# Patient Record
Sex: Female | Born: 1945 | Race: White | Hispanic: No | Marital: Married | Smoking: Never smoker
Health system: Southern US, Community
[De-identification: ages and names within clinical notes are randomized; demographics above are authoritative.]

## PROBLEM LIST (undated history)

## (undated) DIAGNOSIS — M1990 Unspecified osteoarthritis, unspecified site: Secondary | ICD-10-CM

## (undated) DIAGNOSIS — K219 Gastro-esophageal reflux disease without esophagitis: Secondary | ICD-10-CM

## (undated) DIAGNOSIS — I10 Essential (primary) hypertension: Secondary | ICD-10-CM

## (undated) HISTORY — DX: Gastro-esophageal reflux disease without esophagitis: K21.9

## (undated) HISTORY — PX: TONSILLECTOMY: SUR1361

## (undated) HISTORY — DX: Unspecified osteoarthritis, unspecified site: M19.90

## (undated) HISTORY — DX: Essential (primary) hypertension: I10

---

## 2010-02-11 ENCOUNTER — Emergency Department: Payer: Self-pay | Admitting: Emergency Medicine

## 2014-01-08 ENCOUNTER — Emergency Department: Payer: Self-pay | Admitting: Emergency Medicine

## 2014-01-08 LAB — COMPREHENSIVE METABOLIC PANEL
ALT: 23 U/L
ANION GAP: 6 — AB (ref 7–16)
AST: 16 U/L (ref 15–37)
Albumin: 3.4 g/dL (ref 3.4–5.0)
Alkaline Phosphatase: 100 U/L
BUN: 11 mg/dL (ref 7–18)
Bilirubin,Total: 0.6 mg/dL (ref 0.2–1.0)
CHLORIDE: 102 mmol/L (ref 98–107)
Calcium, Total: 9.2 mg/dL (ref 8.5–10.1)
Co2: 28 mmol/L (ref 21–32)
Creatinine: 0.71 mg/dL (ref 0.60–1.30)
Glucose: 118 mg/dL — ABNORMAL HIGH (ref 65–99)
Osmolality: 272 (ref 275–301)
Potassium: 4 mmol/L (ref 3.5–5.1)
Sodium: 136 mmol/L (ref 136–145)
Total Protein: 7.2 g/dL (ref 6.4–8.2)

## 2014-01-08 LAB — URINALYSIS, COMPLETE
BILIRUBIN, UR: NEGATIVE
Bacteria: NONE SEEN
Glucose,UR: NEGATIVE mg/dL (ref 0–75)
Ketone: NEGATIVE
Nitrite: NEGATIVE
PH: 8 (ref 4.5–8.0)
Protein: NEGATIVE
RBC,UR: 3 /HPF (ref 0–5)
Specific Gravity: 1.003 (ref 1.003–1.030)
Squamous Epithelial: NONE SEEN
Transitional Epi: <1
WBC UR: 19 /HPF (ref 0–5)

## 2014-01-08 LAB — CBC
HCT: 44.2 % (ref 35.0–47.0)
HGB: 14.7 g/dL (ref 12.0–16.0)
MCH: 30.5 pg (ref 26.0–34.0)
MCHC: 33.3 g/dL (ref 32.0–36.0)
MCV: 92 fL (ref 80–100)
Platelet: 290 10*3/uL (ref 150–440)
RBC: 4.83 10*6/uL (ref 3.80–5.20)
RDW: 13 % (ref 11.5–14.5)
WBC: 8.2 10*3/uL (ref 3.6–11.0)

## 2014-01-08 LAB — TROPONIN I: Troponin-I: <0.02 ng/mL

## 2014-03-17 ENCOUNTER — Emergency Department: Payer: Self-pay | Admitting: Emergency Medicine

## 2014-04-03 HISTORY — PX: HAND SURGERY: SHX662

## 2014-04-18 ENCOUNTER — Ambulatory Visit: Payer: Self-pay | Admitting: Orthopedic Surgery

## 2014-04-26 ENCOUNTER — Ambulatory Visit: Payer: Self-pay | Admitting: Orthopedic Surgery

## 2014-09-24 NOTE — Op Note (Signed)
PATIENT NAMClaudine Torres:  Clarida, Collyns MR#:  [id number] DATE OF BIRTH:  May 17, 1946  DATE OF PROCEDURE:  04/26/2014  PREOPERATIVE DIAGNOSIS: Chronic extensor tendon subluxation, right long and ring fingers.   POSTOPERATIVE DIAGNOSIS: Chronic extensor tendon subluxation, right long and ring fingers.   PROCEDURE: Extensor tendon realignment, right long and ring fingers.   ANESTHESIA: MAC with local.   SURGEON: Leitha SchullerMichael J. Acie Custis, MD   DESCRIPTION OF PROCEDURE: The patient was brought to the Operating Room and after adequate anesthesia was obtained, the right hand was prepped and draped in the usual sterile fashion. No tourniquet was used. After an initial timeout procedure had been taken, 10 mL of 0.5% Sensorcaine was infiltrated in the area over the extensor tendons at the MCP joint of the long and ring fingers. No tourniquet was required. An incision was made with a flap, radially based. Elevating this flap, the extensor tendon was identified. It was subluxed and there was a tight ulnar-sided structure. The long finger was taken care of first. This was released and THIS allowed for realignment of the tendon. The radial side structures were quite stretched and attenuated. These were incised with a pants-over-vest type closure. The extensor tendon could be a realigned and placed through a range of motion, maintaining midline position of the extensor tendon. Then 4-0 Monocryl and 3-0 Vicryl were used to repair the radial side structures, with local tissues being utilized.   An identical procedure was then carried out on the ring finger with similar results, with good alignment of the extensor tendon. The wounds were irrigated and closed with 4-0 nylon in a simple interrupted fashion. Xeroform, 4x4's, Webril and a dorsal splint with the MCPs in flexion was then applied. The patient was sent to the recovery room in stable condition.   ESTIMATED BLOOD LOSS: Minimal.   COMPLICATIONS: None.   SPECIMEN: None.     ____________________________ Leitha SchullerMichael J. Jaye Saal, MD mjm:MT D: 04/26/2014 09:18:59 ET T: 04/26/2014 11:01:12 ET JOB#: [id number]  cc: Leitha SchullerMichael J. Endrit Gittins, MD, <Dictator> Leitha SchullerMICHAEL J Francisca Harbuck MD ELECTRONICALLY SIGNED 04/26/2014 14:25

## 2014-11-09 DIAGNOSIS — M2420 Disorder of ligament, unspecified site: Secondary | ICD-10-CM | POA: Insufficient documentation

## 2014-12-21 ENCOUNTER — Telehealth: Payer: Self-pay | Admitting: Family Medicine

## 2014-12-21 NOTE — Telephone Encounter (Signed)
Pt is having dizziness and would like someone to call her back.  [phone]  Husbands [phone]

## 2014-12-23 NOTE — Telephone Encounter (Signed)
Spoke with patient and she states that she is taking dramamine 1/2 tablet 2 to 3 times and she is feeling better. She is on the way to see her cardiologist and will see what they say and will call us back and let us know-aa

## 2014-12-27 ENCOUNTER — Other Ambulatory Visit: Payer: Self-pay | Admitting: Family Medicine

## 2014-12-27 DIAGNOSIS — I10 Essential (primary) hypertension: Secondary | ICD-10-CM

## 2015-01-02 DIAGNOSIS — R42 Dizziness and giddiness: Secondary | ICD-10-CM | POA: Insufficient documentation

## 2015-01-02 DIAGNOSIS — K219 Gastro-esophageal reflux disease without esophagitis: Secondary | ICD-10-CM | POA: Insufficient documentation

## 2015-01-02 DIAGNOSIS — I10 Essential (primary) hypertension: Secondary | ICD-10-CM | POA: Insufficient documentation

## 2015-01-02 DIAGNOSIS — M1990 Unspecified osteoarthritis, unspecified site: Secondary | ICD-10-CM | POA: Insufficient documentation

## 2015-01-03 ENCOUNTER — Ambulatory Visit (INDEPENDENT_AMBULATORY_CARE_PROVIDER_SITE_OTHER): Payer: Medicare HMO | Admitting: Family Medicine

## 2015-01-03 ENCOUNTER — Ambulatory Visit
Admission: RE | Admit: 2015-01-03 | Discharge: 2015-01-03 | Disposition: A | Discharge status: Home / Self Care | Payer: Medicare HMO | Source: Ambulatory Visit | Attending: Family Medicine | Admitting: Family Medicine

## 2015-01-03 ENCOUNTER — Encounter: Payer: Self-pay | Admitting: Family Medicine

## 2015-01-03 VITALS — BP 150/78 | HR 72 | Temp 98.2°F | Resp 16 | Wt 113.8 lb

## 2015-01-03 DIAGNOSIS — Z01818 Encounter for other preprocedural examination: Secondary | ICD-10-CM

## 2015-01-03 DIAGNOSIS — R0989 Other specified symptoms and signs involving the circulatory and respiratory systems: Secondary | ICD-10-CM | POA: Diagnosis not present

## 2015-01-03 DIAGNOSIS — I10 Essential (primary) hypertension: Secondary | ICD-10-CM | POA: Diagnosis not present

## 2015-01-03 DIAGNOSIS — M24341 Pathological dislocation of right hand, not elsewhere classified: Secondary | ICD-10-CM
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
## 2015-01-06 ENCOUNTER — Telehealth: Payer: Self-pay

## 2015-01-06 NOTE — Telephone Encounter (Signed)
Left a message on patient's home voicemail (consent in chart) advising her all test were normal and she is cleared for surgery.

## 2015-01-06 NOTE — Telephone Encounter (Signed)
-----   Message from Tamsen Roers, [place] sent at 01/06/2015  8:51 AM EDT ----- No sign of infection in lungs. Medically cleared for orthopedic surgery.

## 2015-03-16 DIAGNOSIS — M21241 Flexion deformity, right finger joints: Secondary | ICD-10-CM | POA: Diagnosis not present

## 2015-04-12 DIAGNOSIS — M24231 Disorder of ligament, right wrist: Secondary | ICD-10-CM | POA: Diagnosis not present

## 2015-04-12 DIAGNOSIS — M21241 Flexion deformity, right finger joints: Secondary | ICD-10-CM | POA: Diagnosis not present

## 2015-04-21 ENCOUNTER — Encounter: Payer: Self-pay | Admitting: Family Medicine

## 2015-04-21 ENCOUNTER — Ambulatory Visit (INDEPENDENT_AMBULATORY_CARE_PROVIDER_SITE_OTHER): Payer: Medicare HMO | Admitting: Family Medicine

## 2015-04-21 VITALS — BP 172/82 | HR 71 | Temp 98.6°F | Resp 16 | Wt 115.2 lb

## 2015-04-21 DIAGNOSIS — G250 Essential tremor: Secondary | ICD-10-CM

## 2015-04-21 DIAGNOSIS — R5383 Other fatigue: Secondary | ICD-10-CM

## 2015-04-21 DIAGNOSIS — I10 Essential (primary) hypertension: Secondary | ICD-10-CM

## 2015-04-21 NOTE — Progress Notes (Signed)
Patient ID: Destiny MoutonGlenda Torres, female   DOB: 11/23/1945, 69 y.o.   MRN: [id number]    Subjective:  Hypertension This is a chronic problem. The current episode started more than 1 year ago. The problem has been gradually worsening since onset. The problem is uncontrolled. Associated symptoms include anxiety and malaise/fatigue. Pertinent negatives include no chest pain, palpitations, peripheral edema, shortness of breath or sweats. There are no associated agents to hypertension. Past treatments include diuretics. There are no compliance problems.   Having some intermittent fatigue and occasional tremor in hands. States she has had this more prominent the past couple weeks. Slight issue with balance that she uses Dramamine to control. Denies dizziness or vertigo. Prior to Admission medications   Medication Sig Start Date End Date Taking? Authorizing Provider  Cholecalciferol (D-5000) 5000 UNITS TABS Take by mouth.   Yes Historical Provider, MD  dimenhyDRINATE (DRAMAMINE) 50 MG tablet Take by mouth.   Yes Historical Provider, MD  Magnesium 500 MG CAPS Take by mouth.   Yes Historical Provider, MD  Multiple Vitamin (MULTI-VITAMINS) TABS Take by mouth.   Yes Historical Provider, MD  triamterene-hydrochlorothiazide (MAXZIDE-25) 37.5-25 MG per tablet TAKE ONE TABLET BY MOUTH ONCE DAILY 12/27/14  Yes Jodell Ciproennis E Chrismon, PA  vitamin C (ASCORBIC ACID) 500 MG tablet Take by mouth.   Yes Historical Provider, MD    Patient Active Problem List   Diagnosis Date Noted  . Arthritis 01/02/2015  . Acid reflux 01/02/2015  . Benign hypertension 01/02/2015  . Dizziness 01/02/2015  . Essential (primary) hypertension 01/02/2015  . Gastro-esophageal reflux disease without esophagitis 01/02/2015  . 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 Hand surgery Right 04/2014  . Tonsillectomy     Family History  Problem Relation Age of Onset  . Heart disease Mother   . Arthritis Mother     . Heart disease Father   . Stroke Father    History reviewed. No pertinent past medical history.  Social History   Social History  . Marital Status: Married    Spouse Name: N/A  . Number of Children: N/A  . Years of Education: N/A   Occupational History  . Not on file.   Social History Main Topics  . Smoking status: Never Smoker   . Smokeless tobacco: Never Used  . Alcohol Use: No  . Drug Use: No  . Sexual Activity: Not on file   Other Topics Concern  . Not on file   Social History Narrative    Allergies  Allergen Reactions  . Penicillins     patient unsure of reaction  . Streptomycin Rash    Review of Systems  Constitutional: Positive for malaise/fatigue.  HENT: Negative.   Eyes: Negative.   Respiratory: Negative.  Negative for shortness of breath.   Cardiovascular: Negative.  Negative for chest pain and palpitations.  Gastrointestinal: Negative.   Genitourinary: Negative.   Musculoskeletal: Negative.   Skin: Negative.   Neurological: Positive for tremors.       Mild in hands over the past year. Intermittent and feels it could be associated with elevation of BP and stress at home (cares for aunt and husband who has PTSD).  Endo/Heme/Allergies: Negative.   Psychiatric/Behavioral: Negative.     There is no immunization history on file for this patient. Objective:  BP 172/82 mmHg  Pulse 71  Temp(Src) 98.6 F (37 C) (Oral)  Resp 16  Wt 115 lb 3.2 oz (52.254 kg)  SpO2 98%  Physical Exam  Constitutional: She is oriented to person, place, and time and well-developed, well-nourished, and in no distress.  HENT:  Head: Normocephalic.  Right Ear: External ear normal.  Left Ear: External ear normal.  Mouth/Throat: Oropharynx is clear and moist.  Eyes: Conjunctivae and EOM are normal.  Neck: Normal range of motion. Neck supple.  Cardiovascular: Normal rate and regular rhythm.   Pulmonary/Chest: Effort normal and breath sounds normal.  Abdominal: Soft.  Bowel sounds are normal.  Musculoskeletal:  Enlarged MCP joints of hands. Some triggering of fingers and history of surgery to right hand joints.  Neurological: She is alert and oriented to person, place, and time. She has normal reflexes. Gait normal.  No tremor today.  Psychiatric: Affect and judgment normal.    Lab Results  Component Value Date   WBC 8.2 01/08/2014   HGB 14.7 01/08/2014   HCT 44.2 01/08/2014   PLT 290 01/08/2014   GLUCOSE 118* 01/08/2014    CMP     Component Value Date/Time   NA 136 01/08/2014 1137   K 4.0 01/08/2014 1137   CL 102 01/08/2014 1137   CO2 28 01/08/2014 1137   GLUCOSE 118* 01/08/2014 1137   BUN 11 01/08/2014 1137   CREATININE 0.71 01/08/2014 1137   CALCIUM 9.2 01/08/2014 1137   PROT 7.2 01/08/2014 1137   ALBUMIN 3.4 01/08/2014 1137   AST 16 01/08/2014 1137   ALT 23 01/08/2014 1137   ALKPHOS 100 01/08/2014 1137   BILITOT 0.6 01/08/2014 1137   GFRNONAA >60 01/08/2014 1137   GFRAA >60 01/08/2014 1137    Assessment and Plan :  1. Essential (primary) hypertension  Recent change in systolic pressure. Still taking the Maxzide daily. Will check routine labs. With tremor, may need to consider adding, or switching to a beta blocker. Recheck pending lab reports. - Lipid panel  2. Benign essential tremor Recent onset. None today. Check labs for metabolic imbalance. States other elder family members have had some tremors. May need beta blocker to control pending reports from labs. - TSH  3. Other fatigue Intermittent. Has gotten a good meal this morning and energy level better than yesterday. Check labs and recheck pending report. - COMPLETE METABOLIC PANEL WITH GFR - CBC with Differential/Platelet   Dortha Kern PA [facility] 04/21/2015 10:04 AM

## 2015-04-24 DIAGNOSIS — G250 Essential tremor: Secondary | ICD-10-CM | POA: Diagnosis not present

## 2015-04-24 DIAGNOSIS — I10 Essential (primary) hypertension: Secondary | ICD-10-CM | POA: Diagnosis not present

## 2015-04-24 DIAGNOSIS — R5383 Other fatigue: Secondary | ICD-10-CM | POA: Diagnosis not present

## 2015-04-25 LAB — CBC WITH DIFFERENTIAL/PLATELET
BASOS: 0 %
Basophils Absolute: 0 10*3/uL (ref 0.0–0.2)
EOS (ABSOLUTE): 0.1 10*3/uL (ref 0.0–0.4)
Eos: 1 %
Hematocrit: 40.3 % (ref 34.0–46.6)
Hemoglobin: 13.4 g/dL (ref 11.1–15.9)
IMMATURE GRANULOCYTES: 1 %
Immature Grans (Abs): 0 10*3/uL (ref 0.0–0.1)
Lymphocytes Absolute: 1.9 10*3/uL (ref 0.7–3.1)
Lymphs: 29 %
MCH: 29.4 pg (ref 26.6–33.0)
MCHC: 33.3 g/dL (ref 31.5–35.7)
MCV: 88 fL (ref 79–97)
MONOCYTES: 9 %
MONOS ABS: 0.6 10*3/uL (ref 0.1–0.9)
NEUTROS PCT: 60 %
Neutrophils Absolute: 4 10*3/uL (ref 1.4–7.0)
Platelets: 303 10*3/uL (ref 150–379)
RBC: 4.56 x10E6/uL (ref 3.77–5.28)
RDW: 13.3 % (ref 12.3–15.4)
WBC: 6.6 10*3/uL (ref 3.4–10.8)

## 2015-04-25 LAB — COMPREHENSIVE METABOLIC PANEL
A/G RATIO: 1.6 (ref 1.1–2.5)
ALT: 11 IU/L (ref 0–32)
AST: 17 IU/L (ref 0–40)
Albumin: 3.8 g/dL (ref 3.5–4.8)
Alkaline Phosphatase: 101 IU/L (ref 39–117)
BUN/Creatinine Ratio: 23 (ref 11–26)
BUN: 16 mg/dL (ref 8–27)
Bilirubin Total: 0.4 mg/dL (ref 0.0–1.2)
CALCIUM: 9.5 mg/dL (ref 8.7–10.3)
CO2: 27 mmol/L (ref 18–29)
Chloride: 97 mmol/L (ref 97–106)
Creatinine, Ser: 0.71 mg/dL (ref 0.57–1.00)
GFR, EST AFRICAN AMERICAN: 99 mL/min/{1.73_m2} (ref >59)
GFR, EST NON AFRICAN AMERICAN: 86 mL/min/{1.73_m2} (ref >59)
GLUCOSE: 92 mg/dL (ref 65–99)
Globulin, Total: 2.4 g/dL (ref 1.5–4.5)
Potassium: 3.9 mmol/L (ref 3.5–5.2)
Sodium: 138 mmol/L (ref 136–144)
TOTAL PROTEIN: 6.2 g/dL (ref 6.0–8.5)

## 2015-04-25 LAB — TSH: TSH: 3.28 u[IU]/mL (ref 0.450–4.500)

## 2015-04-25 LAB — LIPID PANEL
CHOL/HDL RATIO: 2.4 ratio (ref 0.0–4.4)
Cholesterol, Total: 196 mg/dL (ref 100–199)
HDL: 81 mg/dL (ref >39)
LDL Calculated: 102 mg/dL — ABNORMAL HIGH (ref 0–99)
TRIGLYCERIDES: 65 mg/dL (ref 0–149)
VLDL Cholesterol Cal: 13 mg/dL (ref 5–40)

## 2015-04-28 ENCOUNTER — Other Ambulatory Visit: Payer: Self-pay | Admitting: Family Medicine

## 2015-04-28 ENCOUNTER — Telehealth: Payer: Self-pay | Admitting: Family Medicine

## 2015-04-28 NOTE — Telephone Encounter (Signed)
Please advise lab results.

## 2015-04-28 NOTE — Telephone Encounter (Signed)
Pt called for lab results  Call [phone]  Husbands cell number if you call her back today  Thanks teri

## 2015-05-01 ENCOUNTER — Telehealth: Payer: Self-pay

## 2015-05-01 NOTE — Telephone Encounter (Signed)
Left message to call back  

## 2015-05-01 NOTE — Telephone Encounter (Signed)
Advised patient as below. Patient also wanted to know if the labs that were drawn showed any indication that she may have parkinson's disease? Patient reports that she has "all the signs for it". Patient also mentions that she now has lost her sense of smell. Could she have possibly had a stroke? Patient reports that she is willing to come back in for an OV if she needs to be re-evaluated. Patient is also requesting a refill on Maxzide into the pharmacy. She uses [facility] on [address].

## 2015-05-01 NOTE — Telephone Encounter (Signed)
-----   Message from Tamsen Roersennis E Chrismon, [place] sent at 05/01/2015  5:36 AM EST ----- All blood tests are normal. May add Metoprolol Succinate 25 mg qd #30 & 3 RF for BP elevation and tremor. Recheck BP in 4 weeks.

## 2015-05-01 NOTE — Telephone Encounter (Signed)
See lab result note.

## 2015-05-02 ENCOUNTER — Telehealth: Payer: Self-pay | Admitting: Family Medicine

## 2015-05-02 DIAGNOSIS — I10 Essential (primary) hypertension: Secondary | ICD-10-CM

## 2015-05-02 MED ORDER — METOPROLOL SUCCINATE ER 25 MG PO TB24: 25.0000 mg | ORAL_TABLET | Freq: Every day | ORAL | Status: DC

## 2015-05-02 NOTE — Telephone Encounter (Signed)
Contacted patient. Patient states Metoprolol RX was not sent to pharmacy yesterday like indicated in message on 11/28. RX sent to pharmacy.

## 2015-05-02 NOTE — Telephone Encounter (Signed)
Pt has some questions about the new medication she is suppose to be taking.  JX#[phone]/ZHCB#[phone]/MW

## 2015-05-05 ENCOUNTER — Ambulatory Visit (INDEPENDENT_AMBULATORY_CARE_PROVIDER_SITE_OTHER): Payer: Medicare HMO | Admitting: Family Medicine

## 2015-05-05 ENCOUNTER — Encounter: Payer: Self-pay | Admitting: Family Medicine

## 2015-05-05 VITALS — BP 162/78 | HR 78 | Temp 98.5°F | Resp 16 | Wt 113.6 lb

## 2015-05-05 DIAGNOSIS — J40 Bronchitis, not specified as acute or chronic: Secondary | ICD-10-CM

## 2015-05-05 MED ORDER — AZITHROMYCIN 250 MG PO TABS: ORAL_TABLET | ORAL | Status: DC

## 2015-05-05 NOTE — Progress Notes (Signed)
Patient ID: Keylin Ferryman, female   DOB: [birth year], 69 y.o.   MRN: [id number] Name: Destiny Torres   MRN: [id number]    DOB: August 03, 1945   Date:05/05/2015       Progress Note  Subjective  Chief Complaint  Chief Complaint  Patient presents with  . URI   URI  This is a new problem. The current episode started 1 to 4 weeks ago. The problem has been gradually improving. There has been no fever. Associated symptoms include congestion, coughing, a plugged ear sensation, rhinorrhea, sneezing and a sore throat. She has tried nothing for the symptoms.   Patient Active Problem List   Diagnosis Date Noted  . Arthritis 01/02/2015  . Acid reflux 01/02/2015  . Benign hypertension 01/02/2015  . Dizziness 01/02/2015  . Essential (primary) hypertension 01/02/2015  . Gastro-esophageal reflux disease without esophagitis 01/02/2015  . 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 Hand surgery Right 04/2014  . Tonsillectomy     Family History  Problem Relation Age of Onset  . Heart disease Mother   . Arthritis Mother   . Heart disease Father   . Stroke Father     Social History  Substance Use Topics  . Smoking status: Never Smoker   . Smokeless tobacco: Never Used  . Alcohol Use: No    Current outpatient prescriptions:  .  Cholecalciferol (D-5000) 5000 UNITS TABS, Take by mouth., Disp: , Rfl:  .  dimenhyDRINATE (DRAMAMINE) 50 MG tablet, Take by mouth., Disp: , Rfl:  .  Magnesium 500 MG CAPS, Take by mouth., Disp: , Rfl:  .  metoprolol succinate (TOPROL-XL) 25 MG 24 hr tablet, Take 1 tablet (25 mg total) by mouth daily., Disp: 30 tablet, Rfl: 3 .  Multiple Vitamin (MULTI-VITAMINS) TABS, Take by mouth., Disp: , Rfl:  .  triamterene-hydrochlorothiazide (MAXZIDE-25) 37.5-25 MG tablet, TAKE ONE TABLET BY MOUTH ONCE DAILY, Disp: 30 tablet, Rfl: 0 .  vitamin C (ASCORBIC ACID) 500 MG tablet, Take by mouth., Disp: , Rfl:   Allergies  Allergen Reactions  .  Penicillins     patient unsure of reaction  . Streptomycin Rash    Review of Systems  HENT: Positive for congestion, rhinorrhea, sneezing and sore throat.   Eyes: Negative.   Respiratory: Positive for cough.   Cardiovascular: Negative.   Gastrointestinal: Negative.   Genitourinary: Negative.   Musculoskeletal: Negative.   Skin: Negative.   Neurological: Positive for tremors and weakness.  Endo/Heme/Allergies: Negative.   Psychiatric/Behavioral: Negative.    Objective  Filed Vitals:   05/05/15 1014  BP: 162/78  Pulse: 78  Temp: 98.5 F (36.9 C)  TempSrc: Oral  Resp: 16  Weight: 113 lb 9.6 oz (51.529 kg)  SpO2: 98%   BP Readings from Last 3 Encounters:  05/05/15 162/78  04/21/15 172/82  01/03/15 150/78    Physical Exam  Constitutional: She is oriented to person, place, and time and well-developed, well-nourished, and in no distress.  HENT:  Head: Normocephalic and atraumatic.  Right Ear: External ear normal.  Left Ear: External ear normal.  Nose: Nose normal.  Mouth/Throat: Oropharynx is clear and moist.  Slight tenderness left maxillary sinus.  Eyes: Conjunctivae and EOM are normal.  Neck: Normal range of motion. Neck supple.  Cardiovascular: Normal rate, regular rhythm and normal heart sounds.   Pulmonary/Chest: Effort normal.  Some rhonchi in posterior mid lung fields.  Neurological: She is alert and oriented to person, place, and time.  Recent Results (from the past 2160 hour(s))  TSH     Status: None   Collection Time: 04/24/15  8:20 AM  Result Value Ref Range   TSH 3.280 0.450 - 4.500 uIU/mL  CBC with Differential/Platelet     Status: None   Collection Time: 04/24/15  8:20 AM  Result Value Ref Range   WBC 6.6 3.4 - 10.8 x10E3/uL   RBC 4.56 3.77 - 5.28 x10E6/uL   Hemoglobin 13.4 11.1 - 15.9 g/dL   Hematocrit 09.640.3 04.534.0 - 46.6 %   MCV 88 79 - 97 fL   MCH 29.4 26.6 - 33.0 pg   MCHC 33.3 31.5 - 35.7 g/dL   RDW 40.913.3 81.112.3 - 91.415.4 %   Platelets 303  150 - 379 x10E3/uL   Neutrophils 60 %   Lymphs 29 %   Monocytes 9 %   Eos 1 %   Basos 0 %   Neutrophils Absolute 4.0 1.4 - 7.0 x10E3/uL   Lymphocytes Absolute 1.9 0.7 - 3.1 x10E3/uL   Monocytes Absolute 0.6 0.1 - 0.9 x10E3/uL   EOS (ABSOLUTE) 0.1 0.0 - 0.4 x10E3/uL   Basophils Absolute 0.0 0.0 - 0.2 x10E3/uL   Immature Granulocytes 1 %   Immature Grans (Abs) 0.0 0.0 - 0.1 x10E3/uL  Lipid panel     Status: Abnormal   Collection Time: 04/24/15  8:20 AM  Result Value Ref Range   Cholesterol, Total 196 100 - 199 mg/dL   Triglycerides 65 0 - 149 mg/dL   HDL 81 >78>39 mg/dL   VLDL Cholesterol Cal 13 5 - 40 mg/dL   LDL Calculated 295102 (H) 0 - 99 mg/dL   Chol/HDL Ratio 2.4 0.0 - 4.4 ratio units    Comment:                                   T. Chol/HDL Ratio                                             Men  Women                               1/2 Avg.Risk  3.4    3.3                                   Avg.Risk  5.0    4.4                                2X Avg.Risk  9.6    7.1                                3X Avg.Risk 23.4   11.0   Comprehensive metabolic panel     Status: None   Collection Time: 04/24/15  8:20 AM  Result Value Ref Range   Glucose 92 65 - 99 mg/dL   BUN 16 8 - 27 mg/dL   Creatinine, Ser 6.210.71 0.57 - 1.00 mg/dL   GFR calc non Af Amer 86 >59 mL/min/1.73   GFR calc Af  Amer 99 >59 mL/min/1.73   BUN/Creatinine Ratio 23 11 - 26   Sodium 138 136 - 144 mmol/L   Potassium 3.9 3.5 - 5.2 mmol/L   Chloride 97 97 - 106 mmol/L   CO2 27 18 - 29 mmol/L   Calcium 9.5 8.7 - 10.3 mg/dL   Total Protein 6.2 6.0 - 8.5 g/dL   Albumin 3.8 3.5 - 4.8 g/dL   Globulin, Total 2.4 1.5 - 4.5 g/dL   Albumin/Globulin Ratio 1.6 1.1 - 2.5   Bilirubin Total 0.4 0.0 - 1.2 mg/dL   Alkaline Phosphatase 101 39 - 117 IU/L   AST 17 0 - 40 IU/L   ALT 11 0 - 32 IU/L    Assessment & Plan  1. Bronchitis Onset over the past 5 days without fever. Some sinus drainage and cough with greenish sputum. Recommend  Mucinex-DM and Azithromycin. Increase fluid intake and may add Tylenol prn. Recheck if no better in 5-7 days. - azithromycin (ZITHROMAX) 250 MG tablet; Two tablets by mouth today then one daily for 4 days.  Dispense: 6 tablet; Refill: 0

## 2015-05-29 ENCOUNTER — Other Ambulatory Visit: Payer: Self-pay | Admitting: Family Medicine

## 2015-05-30 ENCOUNTER — Other Ambulatory Visit: Payer: Self-pay

## 2015-05-30 MED ORDER — TRIAMTERENE-HCTZ 37.5-25 MG PO TABS: 1.0000 | ORAL_TABLET | Freq: Every day | ORAL | Status: DC

## 2015-05-30 NOTE — Telephone Encounter (Signed)
Patient calling to get this filled. Sees [name]. She needs this filled today please.-aa

## 2015-06-01 NOTE — Telephone Encounter (Signed)
Destiny Torres patient 

## 2015-06-07 DIAGNOSIS — M24231 Disorder of ligament, right wrist: Secondary | ICD-10-CM | POA: Diagnosis not present

## 2015-06-27 ENCOUNTER — Ambulatory Visit (INDEPENDENT_AMBULATORY_CARE_PROVIDER_SITE_OTHER): Payer: Medicare HMO | Admitting: Family Medicine

## 2015-06-27 ENCOUNTER — Encounter: Payer: Self-pay | Admitting: Family Medicine

## 2015-06-27 VITALS — BP 140/76 | HR 67 | Temp 98.2°F | Resp 16 | Ht 59.0 in | Wt 115.0 lb

## 2015-06-27 DIAGNOSIS — I10 Essential (primary) hypertension: Secondary | ICD-10-CM

## 2015-06-27 DIAGNOSIS — R42 Dizziness and giddiness: Secondary | ICD-10-CM | POA: Diagnosis not present

## 2015-06-27 DIAGNOSIS — R2689 Other abnormalities of gait and mobility: Secondary | ICD-10-CM

## 2015-06-27 DIAGNOSIS — G252 Other specified forms of tremor: Secondary | ICD-10-CM

## 2015-06-27 DIAGNOSIS — R258 Other abnormal involuntary movements: Secondary | ICD-10-CM

## 2015-06-27 DIAGNOSIS — R29818 Other symptoms and signs involving the nervous system: Secondary | ICD-10-CM | POA: Diagnosis not present

## 2015-06-27 NOTE — Progress Notes (Signed)
Patient ID: Destiny Torres, female   DOB: 1946/01/12, 70 y.o.   MRN: [id number]       Patient: Destiny Torres Female    DOB: 02-13-1946   70 y.o.   MRN: [id number] Visit Date: 06/27/2015  Today's Provider: Dortha Kern, PA   Chief Complaint  Patient presents with  . Hypertension   Subjective:    HPI  Hypertension, follow-up:  BP Readings from Last 3 Encounters:  06/27/15 140/76  05/05/15 162/78  04/21/15 172/82    She was last seen for hypertension 2 months ago.  BP at that visit was 162/78. Management changes since that visit include labs and Metoprolol succinate 25 mg started. She reports excellent compliance with treatment. She is not having side effects.  She is not exercising. She is adherent to low salt diet.   Outside blood pressures are 160/70's. She is experiencing none.  Patient denies chest pain.   Cardiovascular risk factors include advanced age (older than 27 for men, 51 for women).  Use of agents associated with hypertension: none.     Weight trend: stable Wt Readings from Last 3 Encounters:  06/27/15 115 lb (52.164 kg)  05/05/15 113 lb 9.6 oz (51.529 kg)  04/21/15 115 lb 3.2 oz (52.254 kg)    Current diet: in general, a "healthy" diet    ------------------------------------------------------------------------   Follow up for tremors/dizzines  The patient was last seen for this 2 months ago. Changes made at last visit include labs checked.  She reports excellent compliance with treatment. She feels that condition is Worse. Patient reports having more tremors and feeling dizzy.   ------------------------------------------------------------------------------------     Patient Active Problem List   Diagnosis Date Noted  . Arthritis 01/02/2015  . Acid reflux 01/02/2015  . Benign hypertension 01/02/2015  . Dizziness 01/02/2015  . Essential (primary) hypertension 01/02/2015  . Gastro-esophageal reflux disease without esophagitis 01/02/2015    . 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 Hand surgery Right 04/2014  . Tonsillectomy     Family History  Problem Relation Age of Onset  . Heart disease Mother   . Arthritis Mother   . Heart disease Father   . Stroke Father    Allergies  Allergen Reactions  . Penicillins     patient unsure of reaction  . Streptomycin Rash   Previous Medications   CHOLECALCIFEROL (D-5000) 5000 UNITS TABS    Take by mouth.   DIMENHYDRINATE (DRAMAMINE) 50 MG TABLET    Take by mouth.   MAGNESIUM 500 MG CAPS    Take by mouth.   METOPROLOL SUCCINATE (TOPROL-XL) 25 MG 24 HR TABLET    Take 1 tablet (25 mg total) by mouth daily.   MULTIPLE VITAMIN (MULTI-VITAMINS) TABS    Take by mouth.   TRIAMTERENE-HYDROCHLOROTHIAZIDE (MAXZIDE-25) 37.5-25 MG TABLET    TAKE ONE TABLET BY MOUTH ONCE DAILY   VITAMIN C (ASCORBIC ACID) 500 MG TABLET    Take by mouth.    Review of Systems  Constitutional: Positive for fatigue.  Respiratory: Negative.   Cardiovascular: Negative.   Neurological: Positive for dizziness, tremors and weakness.    Social History  Substance Use Topics  . Smoking status: Never Smoker   . Smokeless tobacco: Never Used  . Alcohol Use: No   Objective:   BP 140/76 mmHg  Pulse 67  Temp(Src) 98.2 F (36.8 C) (Oral)  Resp 16  Ht  (1.499 m)  Wt 115 lb (52.164 kg)  BMI  23.21 kg/m2  SpO2 98% BP Readings from Last 3 Encounters:  06/27/15 140/76  05/05/15 162/78  04/21/15 172/82   Wt Readings from Last 3 Encounters:  06/27/15 115 lb (52.164 kg)  05/05/15 113 lb 9.6 oz (51.529 kg)  04/21/15 115 lb 3.2 oz (52.254 kg)     Physical Exam  Constitutional: She appears well-developed and well-nourished.  HENT:  Head: Normocephalic.  Right Ear: External ear normal.  Left Ear: External ear normal.  Nose: Nose normal.  Mouth/Throat: Oropharynx is clear and moist.  Eyes: Conjunctivae and EOM are normal. Pupils are equal, round, and reactive  to light.  Neck: Normal range of motion. Neck supple. No thyromegaly present.  Cardiovascular: Normal rate, regular rhythm, normal heart sounds and intact distal pulses.   Pulmonary/Chest: Effort normal and breath sounds normal.  Abdominal: Soft. Bowel sounds are normal.  Musculoskeletal:  Enlarged MCP joints in both hands with catching of some joints of left hand with decreased grip strength and good flexion of all DIP and PIP joints.      Recent Results (from the past 2160 hour(s))  TSH     Status: None   Collection Time: 04/24/15  8:20 AM  Result Value Ref Range   TSH 3.280 0.450 - 4.500 uIU/mL  CBC with Differential/Platelet     Status: None   Collection Time: 04/24/15  8:20 AM  Result Value Ref Range   WBC 6.6 3.4 - 10.8 x10E3/uL   RBC 4.56 3.77 - 5.28 x10E6/uL   Hemoglobin 13.4 11.1 - 15.9 g/dL   Hematocrit 16.1 09.6 - 46.6 %   MCV 88 79 - 97 fL   MCH 29.4 26.6 - 33.0 pg   MCHC 33.3 31.5 - 35.7 g/dL   RDW 04.5 40.9 - 81.1 %   Platelets 303 150 - 379 x10E3/uL   Neutrophils 60 %   Lymphs 29 %   Monocytes 9 %   Eos 1 %   Basos 0 %   Neutrophils Absolute 4.0 1.4 - 7.0 x10E3/uL   Lymphocytes Absolute 1.9 0.7 - 3.1 x10E3/uL   Monocytes Absolute 0.6 0.1 - 0.9 x10E3/uL   EOS (ABSOLUTE) 0.1 0.0 - 0.4 x10E3/uL   Basophils Absolute 0.0 0.0 - 0.2 x10E3/uL   Immature Granulocytes 1 %   Immature Grans (Abs) 0.0 0.0 - 0.1 x10E3/uL  Lipid panel     Status: Abnormal   Collection Time: 04/24/15  8:20 AM  Result Value Ref Range   Cholesterol, Total 196 100 - 199 mg/dL   Triglycerides 65 0 - 149 mg/dL   HDL 81 >91 mg/dL   VLDL Cholesterol Cal 13 5 - 40 mg/dL   LDL Calculated 478 (H) 0 - 99 mg/dL   Chol/HDL Ratio 2.4 0.0 - 4.4 ratio units    Comment:                                   T. Chol/HDL Ratio                                             Men  Women                               1/2 Avg.Risk  3.4    3.3  Avg.Risk  5.0    4.4                                 2X Avg.Risk  9.6    7.1                                3X Avg.Risk 23.4   11.0   Comprehensive metabolic panel     Status: None   Collection Time: 04/24/15  8:20 AM  Result Value Ref Range   Glucose 92 65 - 99 mg/dL   BUN 16 8 - 27 mg/dL   Creatinine, Ser 1.61 0.57 - 1.00 mg/dL   GFR calc non Af Amer 86 >59 mL/min/1.73   GFR calc Af Amer 99 >59 mL/min/1.73   BUN/Creatinine Ratio 23 11 - 26   Sodium 138 136 - 144 mmol/L   Potassium 3.9 3.5 - 5.2 mmol/L   Chloride 97 97 - 106 mmol/L   CO2 27 18 - 29 mmol/L   Calcium 9.5 8.7 - 10.3 mg/dL   Total Protein 6.2 6.0 - 8.5 g/dL   Albumin 3.8 3.5 - 4.8 g/dL   Globulin, Total 2.4 1.5 - 4.5 g/dL   Albumin/Globulin Ratio 1.6 1.1 - 2.5   Bilirubin Total 0.4 0.0 - 1.2 mg/dL   Alkaline Phosphatase 101 39 - 117 IU/L   AST 17 0 - 40 IU/L   ALT 11 0 - 32 IU/L    Assessment & Plan:     1. Essential (primary) hypertension Improvement in BP control. Tolerating Metoprolol and Maxzide without side effects. Normal labs on 04-24-15. Continue present regime.  2. Dizziness Off balance sensation and slight light headed sensation worsening. No longer getting relief from Dramamine. Will get CT scan and schedule neurology referral. - CT Head Wo Contrast - Ambulatory referral to Neurology  3. Fine tremor Persistent for more than a year. Only seems to occur with active movement. Disappears at rest. Schedule neurology referral. - Ambulatory referral to Neurology  4. Balance problem Feels unsure of balance when walking or standing alone. No significant ataxia. Will get CT scan of head without contrast and schedule neurology referral. - CT Head Wo Contrast - Ambulatory referral to Neurology       Dortha Kern, PA  [facility]

## 2015-07-03 ENCOUNTER — Telehealth: Payer: Self-pay | Admitting: Family Medicine

## 2015-07-03 DIAGNOSIS — R2689 Other abnormalities of gait and mobility: Secondary | ICD-10-CM

## 2015-07-03 DIAGNOSIS — R251 Tremor, unspecified: Secondary | ICD-10-CM

## 2015-07-03 NOTE — Telephone Encounter (Signed)
Since you decided to change test to MRI of brain w/wo contrast instead of CT,I will need new order,Thanks

## 2015-07-03 NOTE — Telephone Encounter (Signed)
Please review. Thanks!  

## 2015-07-04 ENCOUNTER — Other Ambulatory Visit: Payer: Self-pay | Admitting: Family Medicine

## 2015-07-04 ENCOUNTER — Other Ambulatory Visit: Payer: Self-pay

## 2015-07-04 DIAGNOSIS — R251 Tremor, unspecified: Secondary | ICD-10-CM

## 2015-07-04 DIAGNOSIS — R2689 Other abnormalities of gait and mobility: Secondary | ICD-10-CM

## 2015-07-04 NOTE — Telephone Encounter (Signed)
Please re enter order for MRI of brain

## 2015-07-20 ENCOUNTER — Ambulatory Visit
Admission: RE | Admit: 2015-07-20 | Discharge: 2015-07-20 | Disposition: A | Discharge status: Home / Self Care | Payer: Medicare HMO | Source: Ambulatory Visit | Attending: Family Medicine | Admitting: Family Medicine

## 2015-07-20 DIAGNOSIS — R251 Tremor, unspecified: Secondary | ICD-10-CM | POA: Diagnosis not present

## 2015-07-20 DIAGNOSIS — R42 Dizziness and giddiness: Secondary | ICD-10-CM | POA: Diagnosis not present

## 2015-07-20 DIAGNOSIS — R29818 Other symptoms and signs involving the nervous system: Secondary | ICD-10-CM | POA: Insufficient documentation

## 2015-07-20 DIAGNOSIS — S0990XA Unspecified injury of head, initial encounter: Secondary | ICD-10-CM | POA: Diagnosis not present

## 2015-07-20 LAB — POCT I-STAT CREATININE: Creatinine, Ser: 0.6 mg/dL (ref 0.44–1.00)

## 2015-07-20 MED ORDER — GADOBENATE DIMEGLUMINE 529 MG/ML IV SOLN
10.0000 mL | Freq: Once | INTRAVENOUS | Status: AC | PRN
  Administered 2015-07-20: 10 mL via INTRAVENOUS

## 2015-07-21 ENCOUNTER — Telehealth: Payer: Self-pay

## 2015-07-21 NOTE — Telephone Encounter (Signed)
LMTCB

## 2015-07-21 NOTE — Telephone Encounter (Signed)
The inner ear crystals/canaliths don't cause tremor. If dizziness the only issue, physical therapy for dizziness would be the best place to get training on crystals. If tremor still present, need referral to neurologist.

## 2015-07-21 NOTE — Telephone Encounter (Signed)
Patient advised as directed below. Patient wanted to know if a ENT referral may be useful to balance crystals? Please advise.

## 2015-07-21 NOTE — Telephone Encounter (Signed)
-----   Message from Tamsen Roers, [place] sent at 07/21/2015  2:05 AM EST ----- Normal creatinine blood level and normal scan of head. No sign of abnormality. If dizziness and tremor still present, will need referral to neurologist.

## 2015-07-21 NOTE — Telephone Encounter (Signed)
Pt is returning call.  [phone]/[phone]

## 2015-07-25 NOTE — Telephone Encounter (Signed)
FYI:: Patient advised as directed below. Patient verbalized understanding. Patient states she prefers to wait on a referral to neurology. Patient states she feels the dizziness and tremors are getting better, and would like to give it some more time.

## 2015-07-25 NOTE — Telephone Encounter (Signed)
I understand. Should let us know how the dizziness and tremor is going in 3-4 weeks.

## 2015-07-26 NOTE — Telephone Encounter (Signed)
Patient advised.

## 2015-07-28 ENCOUNTER — Other Ambulatory Visit: Payer: Self-pay | Admitting: Family Medicine

## 2015-07-28 DIAGNOSIS — I10 Essential (primary) hypertension: Secondary | ICD-10-CM

## 2015-07-28 MED ORDER — TRIAMTERENE-HCTZ 37.5-25 MG PO TABS: 1.0000 | ORAL_TABLET | Freq: Every day | ORAL | Status: DC

## 2015-08-23 ENCOUNTER — Other Ambulatory Visit: Payer: Self-pay | Admitting: Family Medicine

## 2015-08-31 ENCOUNTER — Telehealth: Payer: Self-pay

## 2015-08-31 NOTE — Telephone Encounter (Signed)
Patient is requesting that she only take Metoprolol instead of Maxzide. She reports that when she takes both, she feels sluggish and really fatigued. She reports that Metoprolol does not have this effect on her. Patient is wondering if we could increase the dose of Metoprolol if you feel she needs to be on 2 BP medications. She would rather do that then to take Maxzide.

## 2015-08-31 NOTE — Telephone Encounter (Signed)
Should schedule recheck of BP, tremor and labs. May be a good idea to increase the Metoprolol and stop the Maxzide if pulse rate, chemistries and BP will allow.

## 2015-09-01 ENCOUNTER — Telehealth: Payer: Self-pay | Admitting: Family Medicine

## 2015-09-01 NOTE — Telephone Encounter (Signed)
Please review-aa 

## 2015-09-01 NOTE — Telephone Encounter (Signed)
Patient has called office stating that for the past few days she has had episodes of dizziness, she states that she stopped taking her blood pressure medication because she believed that is what was contributing to her dizziness. Patient reports that she only stopped medication for one day ( yesterday 3/30) she states that she started her blood pressure medication again today and has been experiencing shakiness and dizziness. Patient reports that when she checked her blood pressure after symptoms returned her reading was 145/65. Patient would like to know if she should d/c medication or come back in office? Please advise,KW

## 2015-09-01 NOTE — Telephone Encounter (Signed)
Advised patient as below. Appt scheduled on next week to discuss.

## 2015-09-01 NOTE — Telephone Encounter (Signed)
Pt called back to see if it was ok for her to take metoprolol succinate (TOPROL-XL) 25 MG 24 hr tablet today b/c she took that yesterday 08/31/15. Pt stated that she had been taking both metoprolol succinate (TOPROL-XL) 25 MG 24 hr tablet & triamterene-hydrochlorothiazide (MAXZIDE-25) 37.5-25 MG tablet but yesterday she had didn't take the Maxzide. I tried to get pt to come in for an OV but pt stated she couldn't b/c her husband is sick.  I spoke with Boykin Reaperachelle and advised pt she was only supposed to take the Metoprolol not both BP medications. Thanks TNP

## 2015-09-01 NOTE — Telephone Encounter (Signed)
Agree patient may stop the Maxzide and continue Metoprolol 25 mg daily. Keep appointment scheduled for 09-05-15 to evaluate BP and labs.

## 2015-09-05 ENCOUNTER — Ambulatory Visit (INDEPENDENT_AMBULATORY_CARE_PROVIDER_SITE_OTHER): Payer: Medicare HMO | Admitting: Family Medicine

## 2015-09-05 ENCOUNTER — Encounter: Payer: Self-pay | Admitting: Family Medicine

## 2015-09-05 VITALS — BP 158/82 | HR 65 | Temp 98.5°F | Resp 14 | Wt 120.2 lb

## 2015-09-05 DIAGNOSIS — I10 Essential (primary) hypertension: Secondary | ICD-10-CM | POA: Diagnosis not present

## 2015-09-05 DIAGNOSIS — R251 Tremor, unspecified: Secondary | ICD-10-CM | POA: Diagnosis not present

## 2015-09-05 MED ORDER — METOPROLOL SUCCINATE ER 50 MG PO TB24: 50.0000 mg | ORAL_TABLET | Freq: Every day | ORAL | Status: DC

## 2015-09-05 NOTE — Progress Notes (Signed)
Patient ID: Destiny Torres, female   DOB: August 06, 1945, 70 y.o.   MRN: [id number]   Patient: Destiny Torres Female    DOB: 06/16/1945   70 y.o.   MRN: [id number] Visit Date: 09/05/2015  Today's Provider: Dortha Kern, PA   Chief Complaint  Patient presents with  . Hypertension  . Follow-up   Subjective:    HPI  Hypertension, follow-up:  BP Readings from Last 3 Encounters:  09/05/15 158/82  06/27/15 140/76  05/05/15 162/78    She was last seen for hypertension on 06/27/2015 BP at that visit was 140/76. Management changes since that visit include stopped Maxzide . She reports good compliance with treatment. She is not having side effects.  She is exercising. She is adherent to low salt diet.   Outside blood pressures are being checked. She is experiencing none.  Patient denies none.   Cardiovascular risk factors include advanced age (older than 73 for men, 34 for women).  Use of agents associated with hypertension: none.     Weight trend: stable Wt Readings from Last 3 Encounters:  09/05/15 120 lb 3.2 oz (54.522 kg)  06/27/15 115 lb (52.164 kg)  05/05/15 113 lb 9.6 oz (51.529 kg)      ------------------------------------------------------------------------ Patient Active Problem List   Diagnosis Date Noted  . Arthritis 01/02/2015  . Acid reflux 01/02/2015  . Benign hypertension 01/02/2015  . Dizziness 01/02/2015  . Essential (primary) hypertension 01/02/2015  . Gastro-esophageal reflux disease without esophagitis 01/02/2015  . 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 Hand surgery Right 04/2014  . Tonsillectomy     Family History  Problem Relation Age of Onset  . Heart disease Mother   . Arthritis Mother   . Heart disease Father   . Stroke Father      Previous Medications   CHOLECALCIFEROL (D-5000) 5000 UNITS TABS    Take by mouth.   DIMENHYDRINATE (DRAMAMINE) 50 MG TABLET    Take by mouth.   MAGNESIUM 500 MG CAPS     Take by mouth.   MULTIPLE VITAMIN (MULTI-VITAMINS) TABS    Take by mouth.   TRIAMTERENE-HYDROCHLOROTHIAZIDE (MAXZIDE-25) 37.5-25 MG TABLET    Take 1 tablet by mouth daily.   VITAMIN C (ASCORBIC ACID) 500 MG TABLET    Take by mouth.   Allergies  Allergen Reactions  . Penicillins     patient unsure of reaction  . Streptomycin Rash    Review of Systems  Constitutional: Negative.   HENT: Negative.   Eyes: Negative.   Respiratory: Negative.   Cardiovascular: Negative.   Gastrointestinal: Negative.   Endocrine: Negative.   Genitourinary: Negative.   Musculoskeletal: Negative.   Skin: Negative.   Allergic/Immunologic: Negative.   Neurological: Negative.   Hematological: Negative.   Psychiatric/Behavioral: Negative.     Social History  Substance Use Topics  . Smoking status: Never Smoker   . Smokeless tobacco: Never Used  . Alcohol Use: No   Objective:   BP 158/82 mmHg  Pulse 65  Temp(Src) 98.5 F (36.9 C) (Oral)  Resp 14  Wt 120 lb 3.2 oz (54.522 kg)  Physical Exam  Constitutional: She is oriented to person, place, and time. She appears well-developed and well-nourished. No distress.  HENT:  Head: Normocephalic and atraumatic.  Right Ear: Hearing normal.  Left Ear: Hearing normal.  Nose: Nose normal.  Eyes: Conjunctivae and lids are normal. Right eye exhibits no discharge. Left eye exhibits no discharge. No scleral icterus.  Cardiovascular: Normal rate and regular rhythm.   Pulmonary/Chest: Effort normal. No respiratory distress.  Abdominal: Soft. Bowel sounds are normal.  Musculoskeletal:  Mild tremor of hands. Enlarged MCP joints of both hands with inability to fully extend or flex finger in the right hand due to arthritis.  Neurological: She is alert and oriented to person, place, and time.  Skin: Skin is intact. No lesion and no rash noted.  Psychiatric: She has a normal mood and affect. Her speech is normal and behavior is normal. Thought content normal.       Assessment & Plan:     1. Essential (primary) hypertension Has noticed a sensation of fatigue and feeling sluggish with the use of Maxzide. Will increase Toprol-XL to 50 mg qd and stop the Maxzide. Recheck blood chemistry and follow up pending reports. - metoprolol succinate (TOPROL-XL) 50 MG 24 hr tablet; Take 1 tablet (50 mg total) by mouth daily. Take with or immediately following a meal.  Dispense: 30 tablet; Refill: 3 - CBC with Differential/Platelet - Comprehensive metabolic panel - TSH  2. Tremor Improved with use of Toprol. Will increase dosage and follow up routine labs. MRI of head did not show any acute changes and she requested postponement of neurology referral since she is noticing improvement with use of the Toprol. Recheck pending lab reports.

## 2015-09-06 ENCOUNTER — Telehealth: Payer: Self-pay

## 2015-09-06 LAB — TSH: TSH: 3.04 u[IU]/mL (ref 0.450–4.500)

## 2015-09-06 LAB — COMPREHENSIVE METABOLIC PANEL
A/G RATIO: 1.6 (ref 1.2–2.2)
ALT: 11 IU/L (ref 0–32)
AST: 18 IU/L (ref 0–40)
Albumin: 3.8 g/dL (ref 3.5–4.8)
Alkaline Phosphatase: 91 IU/L (ref 39–117)
BUN / CREAT RATIO: 18 (ref 12–28)
BUN: 13 mg/dL (ref 8–27)
Bilirubin Total: 0.7 mg/dL (ref 0.0–1.2)
CALCIUM: 9.3 mg/dL (ref 8.7–10.3)
CO2: 27 mmol/L (ref 18–29)
CREATININE: 0.72 mg/dL (ref 0.57–1.00)
Chloride: 98 mmol/L (ref 96–106)
GFR, EST AFRICAN AMERICAN: 97 mL/min/{1.73_m2} (ref >59)
GFR, EST NON AFRICAN AMERICAN: 85 mL/min/{1.73_m2} (ref >59)
GLUCOSE: 96 mg/dL (ref 65–99)
Globulin, Total: 2.4 g/dL (ref 1.5–4.5)
POTASSIUM: 4.7 mmol/L (ref 3.5–5.2)
SODIUM: 138 mmol/L (ref 134–144)
TOTAL PROTEIN: 6.2 g/dL (ref 6.0–8.5)

## 2015-09-06 LAB — CBC WITH DIFFERENTIAL/PLATELET
BASOS: 0 %
Basophils Absolute: 0 10*3/uL (ref 0.0–0.2)
EOS (ABSOLUTE): 0 10*3/uL (ref 0.0–0.4)
Eos: 1 %
Hematocrit: 37.8 % (ref 34.0–46.6)
Hemoglobin: 12.9 g/dL (ref 11.1–15.9)
IMMATURE GRANS (ABS): 0 10*3/uL (ref 0.0–0.1)
IMMATURE GRANULOCYTES: 0 %
LYMPHS: 31 %
Lymphocytes Absolute: 2.1 10*3/uL (ref 0.7–3.1)
MCH: 30.4 pg (ref 26.6–33.0)
MCHC: 34.1 g/dL (ref 31.5–35.7)
MCV: 89 fL (ref 79–97)
MONOCYTES: 9 %
Monocytes Absolute: 0.6 10*3/uL (ref 0.1–0.9)
NEUTROS PCT: 59 %
Neutrophils Absolute: 4 10*3/uL (ref 1.4–7.0)
PLATELETS: 249 10*3/uL (ref 150–379)
RBC: 4.25 x10E6/uL (ref 3.77–5.28)
RDW: 13.1 % (ref 12.3–15.4)
WBC: 6.7 10*3/uL (ref 3.4–10.8)

## 2015-09-06 NOTE — Telephone Encounter (Signed)
-----   Message from Tamsen Roersennis E Chrism[place], [place] sent at 09/06/2015  9:31 AM EDT ----- All blood tests normal. Proceed with increased dose of Metoprolol and stopping the Maxzide. Recheck BP progress in a month.

## 2015-09-06 NOTE — Telephone Encounter (Signed)
Patient advised as directed below. Patient verbalized understanding,. Patient scheduled for 1 month follow up.

## 2015-10-03 ENCOUNTER — Encounter: Payer: Self-pay | Admitting: Family Medicine

## 2015-10-03 ENCOUNTER — Ambulatory Visit (INDEPENDENT_AMBULATORY_CARE_PROVIDER_SITE_OTHER): Payer: Medicare HMO | Admitting: Family Medicine

## 2015-10-03 VITALS — BP 148/70 | HR 67 | Temp 98.5°F | Resp 14 | Wt 115.8 lb

## 2015-10-03 DIAGNOSIS — R251 Tremor, unspecified: Secondary | ICD-10-CM | POA: Diagnosis not present

## 2015-10-03 DIAGNOSIS — M1990 Unspecified osteoarthritis, unspecified site: Secondary | ICD-10-CM

## 2015-10-03 DIAGNOSIS — I10 Essential (primary) hypertension: Secondary | ICD-10-CM | POA: Diagnosis not present

## 2015-10-03 NOTE — Progress Notes (Signed)
Patient ID: Destiny MoutonGlenda Torres, female   DOB: 10/27/1945, 70 y.o.   MRN: [id number]   Patient: Destiny Torres Female    DOB: 11/05/1945   70 y.o.   MRN: [id number] Visit Date: 10/03/2015  Today's Provider: Dortha Kernennis Chrismon, PA   Chief Complaint  Patient presents with  . Tremors  . Hypertension  . Follow-up   Subjective:    HPI 1. Follow up for tremors  The patient was last seen for this 1 month ago. Changes made at last visit include labs checked, increased Metoprolol, patient preferred to postpone neurology referral.   She reports excellent compliance with treatment. She feels that condition is worse. Patient reports having more tremors and feeling dizzy.       Hypertension, follow-up:  BP Readings from Last 3 Encounters:  10/03/15 148/70  09/05/15 158/82  06/27/15 140/76    She was last seen for hypertension 1 months ago.  BP at that visit was 158/82. Management changes since that visit include increased Metoprolol. She reports good compliance with treatment. She is not having side effects.  She is exercising. She is adherent to low salt diet.   Outside blood pressures are being checked. She is experiencing none.  Patient denies none.   Cardiovascular risk factors include advanced age (older than 3355 for men, 3465 for women).  Use of agents associated with hypertension: none.     Weight trend: stable Wt Readings from Last 3 Encounters:  10/03/15 115 lb 12.8 oz (52.527 kg)  09/05/15 120 lb 3.2 oz (54.522 kg)  06/27/15 115 lb (52.164 kg)    Current diet: low salt  ------------------------------------------------------------------------   Patient Active Problem List   Diagnosis Date Noted  . Arthritis 01/02/2015  . Acid reflux 01/02/2015  . Benign hypertension 01/02/2015  . Dizziness 01/02/2015  . Essential (primary) hypertension 01/02/2015  . Gastro-esophageal reflux disease without esophagitis 01/02/2015  . 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 Hand surgery Right 04/2014  . Tonsillectomy     Family History  Problem Relation Age of Onset  . Heart disease Mother   . Arthritis Mother   . Heart disease Father   . Stroke Father    Previous Medications   CHOLECALCIFEROL (D-5000) 5000 UNITS TABS    Take by mouth.   DIMENHYDRINATE (DRAMAMINE) 50 MG TABLET    Take by mouth.   MAGNESIUM 500 MG CAPS    Take by mouth.   METOPROLOL SUCCINATE (TOPROL-XL) 50 MG 24 HR TABLET    Take 1 tablet (50 mg total) by mouth daily. Take with or immediately following a meal.   MULTIPLE VITAMIN (MULTI-VITAMINS) TABS    Take by mouth.   VITAMIN C (ASCORBIC ACID) 500 MG TABLET    Take by mouth.   Allergies  Allergen Reactions  . Penicillins     patient unsure of reaction  . Streptomycin Rash    Review of Systems  Constitutional: Negative.   HENT: Negative.   Eyes: Negative.   Respiratory: Negative.   Cardiovascular: Negative.   Gastrointestinal: Negative.   Endocrine: Negative.   Genitourinary: Negative.   Musculoskeletal: Negative.   Skin: Negative.   Allergic/Immunologic: Negative.   Neurological: Positive for tremors.  Hematological: Negative.   Psychiatric/Behavioral: Negative.     Social History  Substance Use Topics  . Smoking status: Never Smoker   . Smokeless tobacco: Never Used  . Alcohol Use: No   Objective:   BP 148/70 mmHg  Pulse  67  Temp(Src) 98.5 F (36.9 C) (Oral)  Resp 14  Wt 115 lb 12.8 oz (52.527 kg)  Physical Exam  Constitutional: She is oriented to person, place, and time. She appears well-developed and well-nourished. No distress.  HENT:  Head: Normocephalic and atraumatic.  Right Ear: Hearing normal.  Left Ear: Hearing normal.  Nose: Nose normal.  Eyes: Conjunctivae and lids are normal. Right eye exhibits no discharge. Left eye exhibits no discharge. No scleral icterus.  Cardiovascular: Normal rate and regular rhythm.   Pulmonary/Chest: Effort normal and breath sounds  normal. No respiratory distress.  Abdominal: Soft. Bowel sounds are normal.  Musculoskeletal:  Enlarged both 3rd and 4th MCP joints each hand. History of past surgery for tendon repair without much success. Unable to flex without these joints catching as extensor tendons sublux. Good pulses and sensation.  Neurological: She is alert and oriented to person, place, and time.  Mild tremor - improved.  Skin: Skin is intact. No lesion and no rash noted.  Psychiatric: She has a normal mood and affect. Her speech is normal and behavior is normal. Thought content normal.        Assessment & Plan:     1. Essential (primary) hypertension Good control of BP with Metoprolol succinate increase to 50 mg qd and stopping the Maxzide. Denies chest pains or palpitations. Will continue present dosages and recheck routine labs. - Comprehensive metabolic panel  2. Arthritis Enlarged MCP joint of both hands with inability to fully extend 3rd and 4th fingers of the right hand. Wears compression sleeve for support. Very thin individual. Will check Vitamin-D and magnesium levels with CBC. May use Tylenol or arthritic rubs prn. - VITAMIN D 25 Hydroxy (Vit-D Deficiency, Fractures) - CBC with Differential/Platelet - Magnesium  3. Tremor of both hands Fine tremor of both hands is improved with the use of the increased dosage of Toprolol. Will recheck Vit.-D, CMP and Magnesium level. May still need referral to neurologist.  - VITAMIN D 25 Hydroxy (Vit-D Deficiency, Fractures) - Comprehensive metabolic panel - Magnesium

## 2015-10-03 NOTE — Patient Instructions (Signed)
Essential Tremor A tremor is trembling or shaking that you cannot control. Most tremors affect the hands or arms. Tremors can also affect the head, vocal cords, face, and other parts of the body.  Essential tremor is a tremor without a known cause.  CAUSES Essential tremor has no known cause.  RISK FACTORS You may be at greater risk of essential tremor if:   You have a family member with essential tremor.   You are age 72 or older.   You take certain medicines. SIGNS AND SYMPTOMS The main sign of a tremor is uncontrolled and unintentional rhythmic shaking of a body part.  You may have difficulty eating with a spoon or fork.   You may have difficulty writing.   You may nod your head up and down or side to side.   You may have a quivering voice.  Your tremors:  May get worse over time.   May come and go.   May be more noticeable on one side of your body.   May get worse due to stress, fatigue, caffeine, and extreme heat or cold.  DIAGNOSIS Your health care provider can diagnose essential tremor based on your symptoms, medical history, and a physical examination. There is no single test to diagnose an essential tremor. However, your health care provider may perform a variety of tests to rule out other conditions. Tests may include:   Blood and urine tests.   Imaging studies of your brain, such as:   CT scan.   MRI.   A test that measures involuntary muscle movement (electromyogram). TREATMENT Your tremors may go away without treatment. Mild tremors may not need treatment if they do not affect your day-to-day life. Severe tremors may need to be treated using one or a combination of the following options:   Medicines. This may include medicine that is injected.  Lifestyle changes.   Physical therapy.  HOME CARE INSTRUCTIONS  Take medicines only as directed by your health care provider.   Limit alcohol intake to no more than 1 drink per day for  nonpregnant women and 2 drinks per day for men. One drink equals 12 oz of beer, 5 oz of wine, or 1 oz of hard liquor.  Do not use any tobacco products, including cigarettes, chewing tobacco, or electronic cigarettes. If you need help quitting, ask your health care provider.  Take medicines only as directed by your health care provider.   Avoid extreme heat or cold.   Limit the amount of caffeine you consumeas directed by your health care provider.   Try to get eight hours of sleep each night.  Find ways to manage your stress, such as meditation or yoga.  Keep all follow-up visits as directed by your health care provider. This is important. This includes any physical therapy visits. SEEK MEDICAL CARE IF:  You experience any changes in the location or intensity of your tremors.   You start having a tremor after starting a new medicine.   You have tremor with other symptoms such as:   Numbness.   Tingling.   Pain.   Weakness.   Your tremor gets worse.   Your tremor interferes with your daily life.    This information is not intended to replace advice given to you by your health care provider. Make sure you discuss any questions you have with your health care provider.   Document Released: 06/10/2014 Document Reviewed: 06/10/2014 Elsevier Interactive Patient Education 2016 [employer]  

## 2015-10-04 LAB — COMPREHENSIVE METABOLIC PANEL
ALBUMIN: 4.4 g/dL (ref 3.5–4.8)
ALK PHOS: 108 IU/L (ref 39–117)
ALT: 10 IU/L (ref 0–32)
AST: 18 IU/L (ref 0–40)
Albumin/Globulin Ratio: 1.9 (ref 1.2–2.2)
BILIRUBIN TOTAL: 0.6 mg/dL (ref 0.0–1.2)
BUN / CREAT RATIO: 18 (ref 12–28)
BUN: 14 mg/dL (ref 8–27)
CHLORIDE: 97 mmol/L (ref 96–106)
CO2: 25 mmol/L (ref 18–29)
Calcium: 9.7 mg/dL (ref 8.7–10.3)
Creatinine, Ser: 0.77 mg/dL (ref 0.57–1.00)
GFR calc Af Amer: 90 mL/min/{1.73_m2} (ref >59)
GFR calc non Af Amer: 78 mL/min/{1.73_m2} (ref >59)
GLOBULIN, TOTAL: 2.3 g/dL (ref 1.5–4.5)
Glucose: 93 mg/dL (ref 65–99)
POTASSIUM: 4.9 mmol/L (ref 3.5–5.2)
SODIUM: 138 mmol/L (ref 134–144)
Total Protein: 6.7 g/dL (ref 6.0–8.5)

## 2015-10-04 LAB — CBC WITH DIFFERENTIAL/PLATELET
BASOS ABS: 0 10*3/uL (ref 0.0–0.2)
Basos: 0 %
EOS (ABSOLUTE): 0 10*3/uL (ref 0.0–0.4)
Eos: 0 %
HEMATOCRIT: 41.8 % (ref 34.0–46.6)
Hemoglobin: 14.3 g/dL (ref 11.1–15.9)
Immature Grans (Abs): 0.1 10*3/uL (ref 0.0–0.1)
Immature Granulocytes: 1 %
LYMPHS ABS: 2.6 10*3/uL (ref 0.7–3.1)
Lymphs: 31 %
MCH: 30.1 pg (ref 26.6–33.0)
MCHC: 34.2 g/dL (ref 31.5–35.7)
MCV: 88 fL (ref 79–97)
MONOS ABS: 0.6 10*3/uL (ref 0.1–0.9)
Monocytes: 7 %
Neutrophils Absolute: 5.1 10*3/uL (ref 1.4–7.0)
Neutrophils: 61 %
Platelets: 281 10*3/uL (ref 150–379)
RBC: 4.75 x10E6/uL (ref 3.77–5.28)
RDW: 13.3 % (ref 12.3–15.4)
WBC: 8.4 10*3/uL (ref 3.4–10.8)

## 2015-10-04 LAB — MAGNESIUM: MAGNESIUM: 2.2 mg/dL (ref 1.6–2.3)

## 2015-10-04 LAB — VITAMIN D 25 HYDROXY (VIT D DEFICIENCY, FRACTURES): Vit D, 25-Hydroxy: 31 ng/mL (ref 30.0–100.0)

## 2015-10-09 ENCOUNTER — Telehealth: Payer: Self-pay

## 2015-10-09 NOTE — Telephone Encounter (Signed)
Patient advised as directed below. Patient verbalized understanding. Lab results printed at front desk for pickup per patient's request.  

## 2015-10-09 NOTE — Telephone Encounter (Signed)
-----   Message from Tamsen Roersennis E Chrismon, [place] sent at 10/09/2015  8:16 AM EDT ----- All blood tests normal. Continue present medication regimen. Recheck BP in 1 month. If tremor worsening, should consider neurology referral.

## 2015-10-09 NOTE — Telephone Encounter (Signed)
LMTCB

## 2015-10-10 ENCOUNTER — Ambulatory Visit: Payer: Self-pay | Admitting: Family Medicine

## 2015-11-27 ENCOUNTER — Telehealth: Payer: Self-pay | Admitting: Family Medicine

## 2015-11-27 NOTE — Telephone Encounter (Signed)
Should recheck BP in the office.

## 2015-11-27 NOTE — Telephone Encounter (Signed)
Advised patient as below. Patient reports that she will come in to have BP checked.

## 2015-11-27 NOTE — Telephone Encounter (Signed)
Pt states her BP has been going up and down , 180/70 -149/somthing, pt states she just feels funny.

## 2015-12-18 ENCOUNTER — Ambulatory Visit
Admission: RE | Admit: 2015-12-18 | Discharge: 2015-12-18 | Disposition: A | Discharge status: Home / Self Care | Payer: Medicare HMO | Source: Ambulatory Visit | Attending: Family Medicine | Admitting: Family Medicine

## 2015-12-18 ENCOUNTER — Encounter: Payer: Self-pay | Admitting: Family Medicine

## 2015-12-18 ENCOUNTER — Ambulatory Visit (INDEPENDENT_AMBULATORY_CARE_PROVIDER_SITE_OTHER): Payer: Medicare HMO | Admitting: Family Medicine

## 2015-12-18 ENCOUNTER — Other Ambulatory Visit: Payer: Self-pay | Admitting: Family Medicine

## 2015-12-18 VITALS — BP 132/78 | HR 99 | Temp 98.5°F | Resp 16 | Wt 121.0 lb

## 2015-12-18 DIAGNOSIS — M79672 Pain in left foot: Secondary | ICD-10-CM | POA: Insufficient documentation

## 2015-12-18 DIAGNOSIS — R252 Cramp and spasm: Secondary | ICD-10-CM | POA: Diagnosis not present

## 2015-12-18 DIAGNOSIS — M1990 Unspecified osteoarthritis, unspecified site: Secondary | ICD-10-CM | POA: Diagnosis not present

## 2015-12-18 DIAGNOSIS — R32 Unspecified urinary incontinence: Secondary | ICD-10-CM

## 2015-12-18 DIAGNOSIS — M19072 Primary osteoarthritis, left ankle and foot: Secondary | ICD-10-CM | POA: Diagnosis not present

## 2015-12-18 LAB — POCT URINALYSIS DIPSTICK
Bilirubin, UA: NEGATIVE
GLUCOSE UA: NEGATIVE
KETONES UA: NEGATIVE
LEUKOCYTES UA: NEGATIVE
Nitrite, UA: NEGATIVE
PROTEIN UA: NEGATIVE
Urobilinogen, UA: 0.2
pH, UA: 6.5

## 2015-12-18 NOTE — Progress Notes (Signed)
Patient: Destiny Torres Female    DOB: 08/30/1945   70 y.o.   MRN: [id number] Visit Date: 12/18/2015  Today's Provider: Dortha Kernennis Chrismon, PA   Chief Complaint  Patient presents with  . Leg Pain   Subjective:    HPI Patient reports that she has had muscle cramping in her lower left leg X 2-3 days. Patient reports that her symptoms were at its worse last night. Patient also mentions that she has had some mild swelling. She denies any injury. She does have some difficulty walking due to it feeling weak in her left leg. Patient has been using Bengay to help with the cramping with minimal relief. Husband reported bizzare arm and leg movement during her sleep. She had  bedwetting with this spell. No dysuria.   Past Medical History  Diagnosis Date  . Arthritis   . GERD (gastroesophageal reflux disease)   . Hypertension    Past Surgical History  Procedure Laterality Date  . Hand surgery Right 04/2014  . Tonsillectomy     Family History  Problem Relation Age of Onset  . Heart disease Mother   . Arthritis Mother   . Heart disease Father   . Stroke Father    Allergies  Allergen Reactions  . Penicillins     patient unsure of reaction  . Streptomycin Rash   Current Meds  Medication Sig  . Cholecalciferol (D-5000) 5000 UNITS TABS Take by mouth.  . dimenhyDRINATE (DRAMAMINE) 50 MG tablet Take by mouth.  . Magnesium 500 MG CAPS Take by mouth.  . metoprolol succinate (TOPROL-XL) 50 MG 24 hr tablet Take 1 tablet (50 mg total) by mouth daily. Take with or immediately following a meal.  . Multiple Vitamin (MULTI-VITAMINS) TABS Take by mouth.  . vitamin C (ASCORBIC ACID) 500 MG tablet Take by mouth.    Review of Systems  Musculoskeletal: Positive for myalgias, joint swelling, arthralgias and gait problem.  Skin: Negative.   Neurological: Positive for weakness.    Social History  Substance Use Topics  . Smoking status: Never Smoker   . Smokeless tobacco: Never Used  .  Alcohol Use: No   Objective:   BP 132/78 mmHg  Pulse 99  Temp(Src) 98.5 F (36.9 C)  Resp 16  Wt 121 lb (54.885 kg)  Physical Exam  Constitutional: She is oriented to person, place, and time. She appears well-developed and well-nourished. No distress.  HENT:  Head: Normocephalic and atraumatic.  Right Ear: Hearing normal.  Left Ear: Hearing normal.  Nose: Nose normal.  Eyes: Conjunctivae and lids are normal. Right eye exhibits no discharge. Left eye exhibits no discharge. No scleral icterus.  Pulmonary/Chest: Effort normal. No respiratory distress.  Musculoskeletal:  Enlarged MCP joints of both hands and inability to completely flex fingers. Soreness and slight pink spot dorsum of the left foot. Some soreness in calves without swelling. Pulses are good. Hammer toes with second to crossing over great toe of each foot.  Neurological: She is alert and oriented to person, place, and time.  Skin: Skin is intact. No lesion and no rash noted.  Psychiatric: She has a normal mood and affect. Her speech is normal and behavior is normal. Thought content normal.      Assessment & Plan:     1. Pain in left foot Onset the past day or two. No specific injury known. Husband noticed some flailing of arms and legs last night. Red spot with some discomfort to palpate on the  dorsum of the left foot. Will get x-ray evaluation and may use NSAID of choice prn. Recheck pending x-ray and lab reports. - DG Foot Complete Left - Sedimentation rate  2. Muscle cramps Onset with some swelling over the past 3 days. Denies GI up set or dyspnea. No recent use of diuretic. Will check labs for electrolyte imbalance. - Comprehensive metabolic panel  3. Enuresis First time ever was last night. Woke up with this occurring. Husband raises the concern for seizure activity. Urinalysis was clear of any signs of infection. Suspect nightmare. Will recheck pending in lab reports. May need referral to neurologist.  4.  Arthritis Persistent soreness, enlargement of MCP joints and decreased grip strength - CBC with Differential/Platelet - Rheumatoid Factor       Dortha Kern, PA  [facility]

## 2015-12-19 ENCOUNTER — Telehealth: Payer: Self-pay

## 2015-12-19 LAB — SEDIMENTATION RATE: SED RATE: 3 mm/h (ref 0–40)

## 2015-12-19 LAB — COMPREHENSIVE METABOLIC PANEL
ALT: 16 IU/L (ref 0–32)
AST: 35 IU/L (ref 0–40)
Albumin/Globulin Ratio: 1.7 (ref 1.2–2.2)
Albumin: 4.1 g/dL (ref 3.5–4.8)
Alkaline Phosphatase: 110 IU/L (ref 39–117)
BUN/Creatinine Ratio: 22 (ref 12–28)
BUN: 15 mg/dL (ref 8–27)
Bilirubin Total: 1.1 mg/dL (ref 0.0–1.2)
CALCIUM: 9.5 mg/dL (ref 8.7–10.3)
CO2: 24 mmol/L (ref 18–29)
CREATININE: 0.67 mg/dL (ref 0.57–1.00)
Chloride: 94 mmol/L — ABNORMAL LOW (ref 96–106)
GFR calc Af Amer: 102 mL/min/{1.73_m2} (ref >59)
GFR, EST NON AFRICAN AMERICAN: 88 mL/min/{1.73_m2} (ref >59)
GLOBULIN, TOTAL: 2.4 g/dL (ref 1.5–4.5)
GLUCOSE: 94 mg/dL (ref 65–99)
Potassium: 4.5 mmol/L (ref 3.5–5.2)
Sodium: 133 mmol/L — ABNORMAL LOW (ref 134–144)
Total Protein: 6.5 g/dL (ref 6.0–8.5)

## 2015-12-19 LAB — CBC WITH DIFFERENTIAL/PLATELET
BASOS ABS: 0 10*3/uL (ref 0.0–0.2)
Basos: 0 %
EOS (ABSOLUTE): 0 10*3/uL (ref 0.0–0.4)
EOS: 0 %
HEMATOCRIT: 40.8 % (ref 34.0–46.6)
Hemoglobin: 13.9 g/dL (ref 11.1–15.9)
IMMATURE GRANULOCYTES: 1 %
Immature Grans (Abs): 0.1 10*3/uL (ref 0.0–0.1)
LYMPHS ABS: 1.7 10*3/uL (ref 0.7–3.1)
Lymphs: 17 %
MCH: 29.7 pg (ref 26.6–33.0)
MCHC: 34.1 g/dL (ref 31.5–35.7)
MCV: 87 fL (ref 79–97)
MONOS ABS: 0.7 10*3/uL (ref 0.1–0.9)
Monocytes: 7 %
NEUTROS PCT: 75 %
Neutrophils Absolute: 7.9 10*3/uL — ABNORMAL HIGH (ref 1.4–7.0)
PLATELETS: 293 10*3/uL (ref 150–379)
RBC: 4.68 x10E6/uL (ref 3.77–5.28)
RDW: 13.1 % (ref 12.3–15.4)
WBC: 10.4 10*3/uL (ref 3.4–10.8)

## 2015-12-19 LAB — RHEUMATOID FACTOR: Rhuematoid fact SerPl-aCnc: 17.5 IU/mL — ABNORMAL HIGH (ref 0.0–13.9)

## 2015-12-19 MED ORDER — NAPROXEN 500 MG PO TABS: 500.0000 mg | ORAL_TABLET | Freq: Two times a day (BID) | ORAL | Status: DC

## 2015-12-19 NOTE — Telephone Encounter (Signed)
-----   Message from Tamsen Roersennis E Chrismon, [place] sent at 12/19/2015  8:49 AM EDT ----- No sign of infection. Salt level low. Probable cause of muscle cramps. No sign of dehydration. X-ray and arthritis test confirms discomfort in foot from arthritis. Need Naproxen 500 mg BID #60 to help with any pain in joints. May add a little salt back in diet and recheck levels in 3 weeks. If any more problems with bladder control at night, will get specialist referral scheduled.

## 2015-12-19 NOTE — Telephone Encounter (Signed)
-----   Message from Tamsen Roersennis E Chrismon, [place] sent at 12/18/2015  5:48 PM EDT ----- X-ray of left foot showed arthritis but no fractures. Awaiting lab reports.

## 2015-12-19 NOTE — Telephone Encounter (Signed)
Left message to call back  

## 2015-12-19 NOTE — Telephone Encounter (Signed)
Advised patient as below. Naproxen was sent into [facility] [address] at patient's request. Patient will call in 3 weeks to have labs rechecked.

## 2016-01-04 ENCOUNTER — Other Ambulatory Visit: Payer: Self-pay

## 2016-01-04 DIAGNOSIS — I10 Essential (primary) hypertension: Secondary | ICD-10-CM

## 2016-01-04 MED ORDER — METOPROLOL SUCCINATE ER 50 MG PO TB24: 50.0000 mg | ORAL_TABLET | Freq: Every day | ORAL | 3 refills | Status: DC

## 2016-01-04 NOTE — Telephone Encounter (Signed)
Pharmacy requesting refills. Thanks!  

## 2016-01-13 ENCOUNTER — Encounter: Payer: Self-pay | Admitting: Radiology

## 2016-01-13 ENCOUNTER — Emergency Department: Payer: Medicare HMO

## 2016-01-13 ENCOUNTER — Inpatient Hospital Stay
Admission: EM | Admit: 2016-01-13 | Discharge: 2016-01-16 | DRG: 390 | Disposition: A | Discharge status: Home / Self Care | Payer: Medicare HMO | Attending: Surgery | Admitting: Surgery

## 2016-01-13 DIAGNOSIS — Z8261 Family history of arthritis: Secondary | ICD-10-CM

## 2016-01-13 DIAGNOSIS — Z9889 Other specified postprocedural states: Secondary | ICD-10-CM

## 2016-01-13 DIAGNOSIS — K219 Gastro-esophageal reflux disease without esophagitis: Secondary | ICD-10-CM | POA: Diagnosis present

## 2016-01-13 DIAGNOSIS — K5730 Diverticulosis of large intestine without perforation or abscess without bleeding: Secondary | ICD-10-CM | POA: Diagnosis not present

## 2016-01-13 DIAGNOSIS — R1013 Epigastric pain: Secondary | ICD-10-CM | POA: Diagnosis not present

## 2016-01-13 DIAGNOSIS — M1990 Unspecified osteoarthritis, unspecified site: Secondary | ICD-10-CM | POA: Diagnosis present

## 2016-01-13 DIAGNOSIS — K5660 Unspecified intestinal obstruction: Secondary | ICD-10-CM | POA: Diagnosis present

## 2016-01-13 DIAGNOSIS — R112 Nausea with vomiting, unspecified: Secondary | ICD-10-CM

## 2016-01-13 DIAGNOSIS — K56609 Unspecified intestinal obstruction, unspecified as to partial versus complete obstruction: Secondary | ICD-10-CM | POA: Diagnosis present

## 2016-01-13 DIAGNOSIS — K529 Noninfective gastroenteritis and colitis, unspecified: Secondary | ICD-10-CM | POA: Diagnosis present

## 2016-01-13 DIAGNOSIS — Z880 Allergy status to penicillin: Secondary | ICD-10-CM

## 2016-01-13 DIAGNOSIS — K5669 Other intestinal obstruction: Secondary | ICD-10-CM | POA: Diagnosis not present

## 2016-01-13 DIAGNOSIS — Z0189 Encounter for other specified special examinations: Secondary | ICD-10-CM

## 2016-01-13 DIAGNOSIS — Z823 Family history of stroke: Secondary | ICD-10-CM

## 2016-01-13 DIAGNOSIS — R109 Unspecified abdominal pain: Secondary | ICD-10-CM | POA: Diagnosis not present

## 2016-01-13 DIAGNOSIS — Z4659 Encounter for fitting and adjustment of other gastrointestinal appliance and device: Secondary | ICD-10-CM

## 2016-01-13 DIAGNOSIS — Z8249 Family history of ischemic heart disease and other diseases of the circulatory system: Secondary | ICD-10-CM

## 2016-01-13 DIAGNOSIS — I10 Essential (primary) hypertension: Secondary | ICD-10-CM | POA: Diagnosis present

## 2016-01-13 DIAGNOSIS — Z4682 Encounter for fitting and adjustment of non-vascular catheter: Secondary | ICD-10-CM | POA: Diagnosis not present

## 2016-01-13 LAB — CBC WITH DIFFERENTIAL/PLATELET
BASOS ABS: 0.1 10*3/uL (ref 0–0.1)
Basophils Relative: 1 %
EOS PCT: 0 %
Eosinophils Absolute: 0 10*3/uL (ref 0–0.7)
HEMATOCRIT: 43.8 % (ref 35.0–47.0)
Hemoglobin: 15.2 g/dL (ref 12.0–16.0)
LYMPHS ABS: 1.6 10*3/uL (ref 1.0–3.6)
LYMPHS PCT: 14 %
MCH: 30.3 pg (ref 26.0–34.0)
MCHC: 34.7 g/dL (ref 32.0–36.0)
MCV: 87.3 fL (ref 80.0–100.0)
MONO ABS: 0.5 10*3/uL (ref 0.2–0.9)
MONOS PCT: 5 %
NEUTROS ABS: 9.4 10*3/uL — AB (ref 1.4–6.5)
Neutrophils Relative %: 80 %
Platelets: 251 10*3/uL (ref 150–440)
RBC: 5.01 MIL/uL (ref 3.80–5.20)
RDW: 12.8 % (ref 11.5–14.5)
WBC: 11.6 10*3/uL — ABNORMAL HIGH (ref 3.6–11.0)

## 2016-01-13 LAB — URINALYSIS COMPLETE WITH MICROSCOPIC (ARMC ONLY)
Bilirubin Urine: NEGATIVE
Glucose, UA: NEGATIVE mg/dL
LEUKOCYTES UA: NEGATIVE
NITRITE: NEGATIVE
PH: 7 (ref 5.0–8.0)
PROTEIN: 30 mg/dL — AB
Specific Gravity, Urine: 1.01 (ref 1.005–1.030)
Squamous Epithelial / LPF: NONE SEEN

## 2016-01-13 LAB — COMPREHENSIVE METABOLIC PANEL
ALT: 15 U/L (ref 14–54)
AST: 24 U/L (ref 15–41)
Albumin: 3.9 g/dL (ref 3.5–5.0)
Alkaline Phosphatase: 98 U/L (ref 38–126)
Anion gap: 10 (ref 5–15)
BILIRUBIN TOTAL: 1.5 mg/dL — AB (ref 0.3–1.2)
BUN: 11 mg/dL (ref 6–20)
CO2: 26 mmol/L (ref 22–32)
CREATININE: 0.65 mg/dL (ref 0.44–1.00)
Calcium: 9.6 mg/dL (ref 8.9–10.3)
Chloride: 96 mmol/L — ABNORMAL LOW (ref 101–111)
Glucose, Bld: 124 mg/dL — ABNORMAL HIGH (ref 65–99)
POTASSIUM: 4.4 mmol/L (ref 3.5–5.1)
Sodium: 132 mmol/L — ABNORMAL LOW (ref 135–145)
TOTAL PROTEIN: 7.2 g/dL (ref 6.5–8.1)

## 2016-01-13 LAB — LIPASE, BLOOD: LIPASE: 22 U/L (ref 11–51)

## 2016-01-13 MED ORDER — DIATRIZOATE MEGLUMINE & SODIUM 66-10 % PO SOLN
15.0000 mL | Freq: Once | ORAL | Status: AC
  Administered 2016-01-13: 15 mL via ORAL

## 2016-01-13 MED ORDER — ONDANSETRON 4 MG PO TBDP: 4.0000 mg | ORAL_TABLET | Freq: Four times a day (QID) | ORAL | Status: DC | PRN

## 2016-01-13 MED ORDER — MORPHINE SULFATE (PF) 2 MG/ML IV SOLN
2.0000 mg | INTRAVENOUS | Status: DC | PRN
  Administered 2016-01-13 – 2016-01-15 (×5): 2 mg via INTRAVENOUS
  Filled 2016-01-13 (×5): qty 1

## 2016-01-13 MED ORDER — DIPHENHYDRAMINE HCL 12.5 MG/5ML PO ELIX
12.5000 mg | ORAL_SOLUTION | Freq: Four times a day (QID) | ORAL | Status: DC | PRN
  Filled 2016-01-13: qty 5

## 2016-01-13 MED ORDER — HYDRALAZINE HCL 20 MG/ML IJ SOLN
10.0000 mg | INTRAMUSCULAR | Status: DC | PRN
  Administered 2016-01-13 – 2016-01-14 (×2): 10 mg via INTRAVENOUS
  Filled 2016-01-13: qty 1

## 2016-01-13 MED ORDER — ENOXAPARIN SODIUM 40 MG/0.4ML ~~LOC~~ SOLN
40.0000 mg | SUBCUTANEOUS | Status: DC
  Administered 2016-01-14 – 2016-01-15 (×3): 40 mg via SUBCUTANEOUS
  Filled 2016-01-13 (×3): qty 0.4

## 2016-01-13 MED ORDER — HYDRALAZINE HCL 20 MG/ML IJ SOLN
INTRAMUSCULAR | Status: AC
  Administered 2016-01-14: 10 mg via INTRAVENOUS
  Filled 2016-01-13: qty 1

## 2016-01-13 MED ORDER — SODIUM CHLORIDE 0.9 % IV SOLN
1000.0000 mL | Freq: Once | INTRAVENOUS | Status: AC
  Administered 2016-01-13: 1000 mL via INTRAVENOUS

## 2016-01-13 MED ORDER — IOPAMIDOL (ISOVUE-300) INJECTION 61%
80.0000 mL | Freq: Once | INTRAVENOUS | Status: AC | PRN
  Administered 2016-01-13: 80 mL via INTRAVENOUS

## 2016-01-13 MED ORDER — METOPROLOL TARTRATE 5 MG/5ML IV SOLN
5.0000 mg | Freq: Four times a day (QID) | INTRAVENOUS | Status: DC
  Administered 2016-01-14: 5 mg via INTRAVENOUS
  Filled 2016-01-13: qty 5

## 2016-01-13 MED ORDER — ONDANSETRON HCL 4 MG/2ML IJ SOLN: 4.0000 mg | Freq: Four times a day (QID) | INTRAMUSCULAR | Status: DC | PRN

## 2016-01-13 MED ORDER — MORPHINE SULFATE (PF) 4 MG/ML IV SOLN
4.0000 mg | Freq: Once | INTRAVENOUS | Status: AC
  Administered 2016-01-13: 4 mg via INTRAVENOUS
  Filled 2016-01-13: qty 1

## 2016-01-13 MED ORDER — ONDANSETRON HCL 4 MG/2ML IJ SOLN
4.0000 mg | Freq: Once | INTRAMUSCULAR | Status: AC
  Administered 2016-01-13: 4 mg via INTRAVENOUS
  Filled 2016-01-13: qty 2

## 2016-01-13 MED ORDER — DEXTROSE IN LACTATED RINGERS 5 % IV SOLN
INTRAVENOUS | Status: DC
  Administered 2016-01-13 – 2016-01-15 (×8): via INTRAVENOUS

## 2016-01-13 MED ORDER — DIPHENHYDRAMINE HCL 50 MG/ML IJ SOLN: 12.5000 mg | Freq: Four times a day (QID) | INTRAMUSCULAR | Status: DC | PRN

## 2016-01-13 NOTE — ED Provider Notes (Signed)
[facility] Emergency Department Provider Note        Time seen: ----------------------------------------- 5:57 PM on 01/13/2016 -----------------------------------------    I have reviewed the triage vital signs and the nursing notes.   HISTORY  Chief Complaint Abdominal Pain    HPI Destiny Torres is a 70 y.o. female who presents to ER for nausea and epigastric abdominal pain with one episode of diarrhea since she woke up this morning.Patient has not had these symptoms before, has had indigestion and reflux symptoms. She states she takes a Pepcid before she goes to bed every night. She denies fever, has never experienced this pain before. She denies having had abdominal surgery in the past.   Past Medical History:  Diagnosis Date  . Arthritis   . GERD (gastroesophageal reflux disease)   . Hypertension     Patient Active Problem List   Diagnosis Date Noted  . Tremor of both hands 10/03/2015  . Arthritis 01/02/2015  . Acid reflux 01/02/2015  . Benign hypertension 01/02/2015  . Dizziness 01/02/2015  . Essential (primary) hypertension 01/02/2015  . Gastro-esophageal reflux disease without esophagitis 01/02/2015  . 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 HAND SURGERY Right 04/2014  . TONSILLECTOMY      Allergies Penicillins and Streptomycin  Social History Social History  Substance Use Topics  . Smoking status: Never Smoker  . Smokeless tobacco: Never Used  . Alcohol use No    Review of Systems Constitutional: Negative for fever. Cardiovascular: Negative for chest pain. Respiratory: Negative for shortness of breath. Gastrointestinal: Positive for abdominal pain, nausea, diarrhea Genitourinary: Negative for dysuria. Musculoskeletal: Negative for back pain. Skin: Negative for rash. Neurological: Negative for headaches, focal weakness or numbness.  10-point ROS otherwise  negative.  ____________________________________________   PHYSICAL EXAM:  VITAL SIGNS: ED Triage Vitals  Enc Vitals Group     BP 01/13/16 1753 (!) 199/73     Pulse Rate 01/13/16 1753 65     Resp 01/13/16 1753 18     Temp 01/13/16 1753 98.6 F (37 C)     Temp Source 01/13/16 1753 Oral     SpO2 01/13/16 1753 98 %     Weight 01/13/16 1753 120 lb (54.4 kg)     Height 01/13/16 1753  (1.575 m)     Head Circumference --      Peak Flow --      Pain Score 01/13/16 1754 9     Pain Loc --      Pain Edu? --      Excl. in GC? --     Constitutional: Alert and oriented. Well appearing and in no distress. Eyes: Conjunctivae are normal. PERRL. Normal extraocular movements. ENT   Head: Normocephalic and atraumatic.   Nose: No congestion/rhinnorhea.   Mouth/Throat: Mucous membranes are moist.   Neck: No stridor. Cardiovascular: Normal rate, regular rhythm. No murmurs, rubs, or gallops. Respiratory: Normal respiratory effort without tachypnea nor retractions. Breath sounds are clear and equal bilaterally. No wheezes/rales/rhonchi. Gastrointestinal: Soft and nontender. Normal bowel sounds Musculoskeletal: Nontender with normal range of motion in all extremities. No lower extremity tenderness nor edema. Neurologic:  Normal speech and language. No gross focal neurologic deficits are appreciated.  Skin:  Skin is warm, dry and intact. No rash noted. Psychiatric: Mood and affect are normal. Speech and behavior are normal.  ____________________________________________  EKG: Interpreted by me.Sinus rhythm with a rate of 68 bpm, normal PR interval, normal  QRS size, normal QT interval. Normal axis.  ____________________________________________  ED COURSE:  Pertinent labs & imaging results that were available during my care of the patient were reviewed by me and considered in my medical decision making (see chart for details). Clinical Course  Patient is in no acute distress,  will check basic labs, give IV fluids, morphine and Zofran.  Procedures ____________________________________________   LABS (pertinent positives/negatives)  Labs Reviewed  CBC WITH DIFFERENTIAL/PLATELET - Abnormal; Notable for the following:       Result Value   WBC 11.6 (*)    Neutro Abs 9.4 (*)    All other components within normal limits  COMPREHENSIVE METABOLIC PANEL - Abnormal; Notable for the following:    Sodium 132 (*)    Chloride 96 (*)    Glucose, Bld 124 (*)    Total Bilirubin 1.5 (*)    All other components within normal limits  LIPASE, BLOOD  URINALYSIS COMPLETEWITH MICROSCOPIC ([facility] ONLY)    RADIOLOGY Images were viewed by me  Abdomen 2 view, CT of the abdomen and pelvis with contrast IMPRESSION: 1. Small bowel obstruction with a gradual transition to normal caliber small bowel in the right mid to lower abdomen. There is associated fecalization of small bowel contents and marked gastric dilatation. No mass or stricture visualized. 2. Associated mesenteric fluid and small amount of free peritoneal fluid. 3. Sigmoid diverticulosis. 4. Cholelithiasis. 5. Aortic atherosclerosis.  ____________________________________________  FINAL ASSESSMENT AND PLAN  Abdominal pain, small bowel obstruction  Plan: Patient with labs and imaging as dictated above. Patient's in no acute distress, unclear etiology for her bowel obstruction. I will discuss with general surgery. We will place an NG tube for gastric decompression.   Emily FilbertWilliams, Jonathan E, MD   Note: This dictation was prepared with Dragon dictation. Any transcriptional errors that result from this process are unintentional    Emily FilbertJonathan E Williams, MD 01/13/16 2046

## 2016-01-13 NOTE — ED Notes (Signed)
Pt using call bell; says epigastric pain is still 10/10 after pain medication; repositioned for comfort; warm blanket provided; informed pt's primary nurse, Megan, of pt's pain rating and that she has finished her oral contrast; Megan to call CT to notify of same

## 2016-01-13 NOTE — H&P (Addendum)
Patient ID: Destiny Torres, female   DOB: 1945/10/19, 70 y.o.   MRN: [id number]  HPI Destiny Torres is a 70 y.o. female asked to see by the emergency room team regarding a bowel obstruction. She reports that her pain started last night and woke her from her sleep. She states that her pain is sharp intermittent diffuse in her upper abdomen. No specific alleviating or aggravating factors. The pain is moderate to severe in intensity and she denies having this pain before. Of note she is never had any abdominal operations. She did pass gas today and did have a bowel movement. She states that she usually has 4-5 bowel movements a day but she only had 1 diarrhea episode today. Also the husband had a recent episode of enteritis reflected with diarrhea a couple days ago. She has had are sitting nausea but no emesis. No fevers no chills or any other constitutional symptoms   The workup CT scan was performed and a half personally reviewed there is evidence of gastric dilation and small bowel dilation with decompressed terminal ileum. There is a gradual dilation of the small bowel without definitive marked transition point. There is some intraperitoneal fluid there is no evidence of pneumatosis no evidence of ischemic bowel or free air.  HPI  Past Medical History:  Diagnosis Date  . Arthritis   . GERD (gastroesophageal reflux disease)   . Hypertension     Past Surgical History:  Procedure Laterality Date  . HAND SURGERY Right 04/2014  . TONSILLECTOMY      Family History  Problem Relation Age of Onset  . Heart disease Mother   . Arthritis Mother   . Heart disease Father   . Stroke Father     Social History Social History  Substance Use Topics  . Smoking status: Never Smoker  . Smokeless tobacco: Never Used  . Alcohol use No    Allergies  Allergen Reactions  . Penicillins     patient unsure of reaction  . Streptomycin Rash    Current Facility-Administered Medications  Medication Dose  Route Frequency Provider Last Rate Last Dose  . dextrose 5 % in lactated ringers infusion   Intravenous Continuous Destiny Kell F Keimani Laufer, MD      . diphenhydrAMINE (BENADRYL) 12.5 MG/5ML elixir 12.5 mg  12.5 mg Oral Q6H PRN [name], MD       Or  . diphenhydrAMINE (BENADRYL) injection 12.5 mg  12.5 mg Intravenous Q6H PRN [name], MD      . enoxaparin (LOVENOX) injection 40 mg  40 mg Subcutaneous Q24H [name], MD      . hydrALAZINE (APRESOLINE) injection 10 mg  10 mg Intravenous Q2H PRN [name], MD      . Melene Muller ON 01/14/2016] metoprolol (LOPRESSOR) injection 5 mg  5 mg Intravenous Q6H [name], MD      . morphine 2 MG/ML injection 2 mg  2 mg Intravenous Q2H PRN [name], MD      . ondansetron (ZOFRAN-ODT) disintegrating tablet 4 mg  4 mg Oral Q6H PRN [name], MD       Or  . ondansetron (ZOFRAN) injection 4 mg  4 mg Intravenous Q6H PRN [name], MD       Current Outpatient Prescriptions  Medication Sig Dispense Refill  . cholecalciferol (VITAMIN D) 1000 units tablet Take 1,000 Units by mouth daily.    . Magnesium 500 MG CAPS Take by mouth.    Marland Kitchen  metoprolol succinate (TOPROL-XL) 50 MG 24 hr tablet Take 1 tablet (50 mg total) by mouth daily. Take with or immediately following a meal. 30 tablet 3  . Multiple Vitamin (MULTI-VITAMINS) TABS Take by mouth.    . vitamin C (ASCORBIC ACID) 500 MG tablet Take by mouth.    . naproxen (NAPROSYN) 500 MG tablet Take 1 tablet (500 mg total) by mouth 2 (two) times daily with a meal. 60 tablet 0     Review of Systems A 10 point review of systems was asked and was negative except for the information on the HPI  Physical Exam Blood pressure (!) 191/71, pulse 65, temperature 98.6 F (37 C), temperature source Oral, resp. rate 18, height 5\' 2"  (1.575 m), weight 54.4 kg (120 lb), SpO2 96 %. CONSTITUTIONAL: NAD elderly female EYES: Pupils are equal, round, and reactive to light, Sclera are non-icteric. EARS, NOSE, MOUTH AND  THROAT: The oropharynx is clear. The oral mucosa is pink and moist. Hearing is intact to voice. LYMPH NODES:  Lymph nodes in the neck are normal. RESPIRATORY:  Lungs are clear. There is normal respiratory effort, with equal breath sounds bilaterally, and without pathologic use of accessory muscles. CARDIOVASCULAR: Heart is regular without murmurs, gallops, or rubs. GI: The abdomen is  soft,  Mildly distended and mild TTP LUQ, no peritonitis. There are no palpable masses. There is no hepatosplenomegaly.  GU: Rectal deferred.   MUSCULOSKELETAL: Normal muscle strength and tone. No cyanosis or edema.   SKIN: Turgor is good and there are no pathologic skin lesions or ulcers. NEUROLOGIC: Motor and sensation is grossly normal. Cranial nerves are grossly intact. PSYCH:  Oriented to person, place and time. Affect is normal.  Data Reviewed  I have personally reviewed the patient's imaging, laboratory findings and medical records.    Assessment/Plan 70 year old female with clinically appears to HAVE a partial small bowel obstruction. My concern is that she has never had any abdominal operation. She certainly is in no acute distress, nontoxic and does not require any emergent surgical procedures tonight. But  we'll definitely need to admit her and watch her closely. Discussed with the patient in detail about her disease process. She is adamant about not having any surgical intervention unless it is absolutely necessary. She believes more in a naturalistic approach. I am I will plan to admit her and place an NG tube with serial abdominal examinations and serial abdominal x-ray. She understands that if her condition deteriorates we may need to intervene. We'll have to adjust her medications for blood pressure on an IV form. [name], MD FACS General Surgeon 01/13/2016, 9:44 PM

## 2016-01-13 NOTE — ED Notes (Signed)
Report called to Lauren, RN.

## 2016-01-13 NOTE — ED Notes (Signed)
Pt returned from CT °

## 2016-01-13 NOTE — ED Triage Notes (Signed)
Pt reports since waking up this morning Nausea, generalized abd pain and diarrhea.

## 2016-01-13 NOTE — ED Notes (Signed)
Pt resting in bed at this time. Eyes closed, respirations even and unlabored. Will continue to monitor for further patient needs. 

## 2016-01-13 NOTE — Progress Notes (Signed)
Pt arrived on the floor. BP elevated to 167/45.  Schedule lopressor will be given to pt. Right NG tube is present and is hooked up low intermit suction.   Karsten RoLauren E Hobbs

## 2016-01-14 ENCOUNTER — Inpatient Hospital Stay: Payer: Medicare HMO

## 2016-01-14 DIAGNOSIS — R1013 Epigastric pain: Secondary | ICD-10-CM

## 2016-01-14 DIAGNOSIS — K5669 Other intestinal obstruction: Secondary | ICD-10-CM

## 2016-01-14 LAB — COMPREHENSIVE METABOLIC PANEL
ALT: 12 U/L — ABNORMAL LOW (ref 14–54)
AST: 22 U/L (ref 15–41)
Albumin: 3.3 g/dL — ABNORMAL LOW (ref 3.5–5.0)
Alkaline Phosphatase: 88 U/L (ref 38–126)
Anion gap: 7 (ref 5–15)
BUN: 11 mg/dL (ref 6–20)
CHLORIDE: 98 mmol/L — AB (ref 101–111)
CO2: 27 mmol/L (ref 22–32)
Calcium: 8.8 mg/dL — ABNORMAL LOW (ref 8.9–10.3)
Creatinine, Ser: 0.66 mg/dL (ref 0.44–1.00)
Glucose, Bld: 213 mg/dL — ABNORMAL HIGH (ref 65–99)
POTASSIUM: 3.9 mmol/L (ref 3.5–5.1)
Sodium: 132 mmol/L — ABNORMAL LOW (ref 135–145)
Total Bilirubin: 1.2 mg/dL (ref 0.3–1.2)
Total Protein: 6.4 g/dL — ABNORMAL LOW (ref 6.5–8.1)

## 2016-01-14 LAB — MAGNESIUM: MAGNESIUM: 1.7 mg/dL (ref 1.7–2.4)

## 2016-01-14 LAB — CBC
HEMATOCRIT: 44.7 % (ref 35.0–47.0)
HEMOGLOBIN: 15.3 g/dL (ref 12.0–16.0)
MCH: 30.2 pg (ref 26.0–34.0)
MCHC: 34.3 g/dL (ref 32.0–36.0)
MCV: 88.1 fL (ref 80.0–100.0)
Platelets: 258 10*3/uL (ref 150–440)
RBC: 5.08 MIL/uL (ref 3.80–5.20)
RDW: 12.7 % (ref 11.5–14.5)
WBC: 19.1 10*3/uL — AB (ref 3.6–11.0)

## 2016-01-14 MED ORDER — FAMOTIDINE IN NACL 20-0.9 MG/50ML-% IV SOLN
20.0000 mg | Freq: Two times a day (BID) | INTRAVENOUS | Status: DC
  Administered 2016-01-14 – 2016-01-15 (×3): 20 mg via INTRAVENOUS
  Filled 2016-01-14 (×5): qty 50

## 2016-01-14 MED ORDER — METOPROLOL TARTRATE 5 MG/5ML IV SOLN
5.0000 mg | Freq: Four times a day (QID) | INTRAVENOUS | Status: DC
  Administered 2016-01-14 – 2016-01-16 (×7): 5 mg via INTRAVENOUS
  Filled 2016-01-14 (×7): qty 5

## 2016-01-14 MED ORDER — SODIUM CHLORIDE 0.9 % IV BOLUS (SEPSIS)
1000.0000 mL | Freq: Once | INTRAVENOUS | Status: AC
  Administered 2016-01-14: 1000 mL via INTRAVENOUS

## 2016-01-14 NOTE — Progress Notes (Signed)
SBO Feeling better No flatus today AVSS  No abd pain  PE NAD Abd: soft, NT, no peritonitis  A/P continue medical rx w careful observation, abd xray in am and labs in am. No need for emergent surgical intervention tonight

## 2016-01-14 NOTE — Progress Notes (Signed)
CC: SBO Subjective: This patient admitted to the hospital with a tentative diagnosis of a partial small bowel obstruction. She has had nausea and vomiting and is passing some gas she states she feels much better now that the nasogastric tube was placed she was medicated earlier this morning however. She has never had an episode like this before.  She has not had any surgeries but of considerable significance is the fact that her husband had what she called "a stomach virus" on Tuesday of this week. He had very similar symptoms.  Objective: Vital signs in last 24 hours: Temp:  [98.3 F (36.8 C)-98.6 F (37 C)] 98.3 F (36.8 C) (08/13 0359) Pulse Rate:  [61-80] 75 (08/13 0359) Resp:  [14-18] 17 (08/13 0359) BP: (147-212)/(45-100) 147/64 (08/13 0359) SpO2:  [94 %-98 %] 96 % (08/13 0359) Weight:  [120 lb (54.4 kg)] 120 lb (54.4 kg) (08/12 1753) Last BM Date: 01/13/16  Intake/Output from previous day: 08/12 0701 - 08/13 0700 In: 894 [I.V.:859; NG/GT:35] Out: 525 [Urine:300; Emesis/NG output:225] Intake/Output this shift: No intake/output data recorded.  Physical exam: There is a foul odor emanating from the patient presumably from flatus. Vital signs are stable she is afebrile Awake alert and oriented nasogastric tube in place Abdomen is soft distended tympanitic and essentially nontender Calves are nontender Icterus no jaundice  Lab Results: CBC   Recent Labs  01/13/16 1821 01/14/16 0604  WBC 11.6* 19.1*  HGB 15.2 15.3  HCT 43.8 44.7  PLT 251 258   BMET  Recent Labs  01/13/16 1821 01/14/16 0604  NA 132* 132*  K 4.4 3.9  CL 96* 98*  CO2 26 27  GLUCOSE 124* 213*  BUN 11 11  CREATININE 0.65 0.66  CALCIUM 9.6 8.8*   PT/INR No results for input(s): LABPROT, INR in the last 72 hours. ABG No results for input(s): PHART, HCO3 in the last 72 hours.  Invalid input(s): PCO2, PO2  Studies/Results: Dg Abd 1 View  Result Date: 01/13/2016 CLINICAL DATA:   Nasogastric tube placement.  Initial encounter. EXAM: ABDOMEN - 1 VIEW COMPARISON:  CT of the abdomen and pelvis performed earlier today at 8:16 p.m. FINDINGS: The patient's enteric tube is seen ending overlying the antrum of the stomach. There is persistent distention of small-bowel loops, concerning for bowel obstruction. The small bowel loops demonstrate an unusual amount of inflammation on CT, without definite bowel ischemia, though impending ischemic injury cannot be entirely excluded. Contrast is seen within the renal calyces and within the bladder. No free intra-abdominal air is seen, though evaluation for free air is limited on supine views. No acute osseous abnormalities are identified. IMPRESSION: Enteric tube seen ending overlying the antrum of the stomach. Persistent distention of small-bowel loops, concerning for bowel obstruction. Unusual amount of inflammation noted with regard to the small bowel loops on CT. Electronically Signed   By: Roanna Raider M.D.   On: 01/13/2016 21:44   Ct Abdomen Pelvis W Contrast  Result Date: 01/13/2016 CLINICAL DATA:  Epigastric and left upper quadrant abdominal pain since last night. Nausea. Small bowel dilatation on recent abdomen radiographs. EXAM: CT ABDOMEN AND PELVIS WITH CONTRAST TECHNIQUE: Multidetector CT imaging of the abdomen and pelvis was performed using the standard protocol following bolus administration of intravenous contrast. CONTRAST:  80mL ISOVUE-300 IOPAMIDOL (ISOVUE-300) INJECTION 61% COMPARISON:  Abdomen radiographs obtained earlier today. FINDINGS: Lower chest:  Mild dependent atelectasis at both lung bases. Hepatobiliary: Multiple gallstones in the gallbladder, measuring up to 12 mm in maximum  diameter each. No gallbladder wall thickening or pericholecystic fluid. Small, normal appearing liver. Pancreas: No mass, inflammatory changes, or other significant abnormality. Spleen: Small splenic cyst. Adrenals/Urinary Tract: Tiny amount of air in  the urinary bladder, compatible with recent catheterization. No urinary tract masses, calculi or hydronephrosis seen. Tiny probable cysts in the cortex of both kidneys. Normal appearing adrenal glands. Stomach/Bowel: Markedly dilated stomach. Multiple dilated loops of jejunum with normal caliber distal small bowel. There is a gradual transition to normal caliber small bowel in the right lower abdomen. The dilated loops contain feculent appearing material. There is mesenteric fluid adjacent to the most dilated jejunum in the left mid abdomen and in the midline lower abdomen. No bowel wall thickening or pneumatosis seen. No evidence of appendicitis. Multiple sigmoid colon diverticula without evidence of diverticulitis. Vascular/Lymphatic: Atheromatous arterial calcifications, including the abdominal aorta. No vascular occlusions seen. No enlarged lymph nodes. Reproductive: No mass or other significant abnormality. Other: In addition to the free peritoneal fluid in the mesentery, there is a small amount of free peritoneal fluid in the inferior pelvis in the cul-de-sac. No free peritoneal air is seen. Musculoskeletal: Marked lumbar and lower thoracic spine degenerative changes. No fractures or pars defects. IMPRESSION: 1. Small bowel obstruction with a gradual transition to normal caliber small bowel in the right mid to lower abdomen. There is associated fecalization of small bowel contents and marked gastric dilatation. No mass or stricture visualized. 2. Associated mesenteric fluid and small amount of free peritoneal fluid. 3. Sigmoid diverticulosis. 4. Cholelithiasis. 5. Aortic atherosclerosis. Electronically Signed   By: Beckie Salts M.D.   On: 01/13/2016 20:39   Dg Abd 2 Views  Result Date: 01/13/2016 CLINICAL DATA:  Nausea and left upper abdomen pain EXAM: ABDOMEN - 2 VIEW COMPARISON:  None. FINDINGS: Scattered large and small bowel gas is noted. Mild dilatation of the small bowel is noted in left mid abdomen  and pelvis. No free air is seen. No abnormal mass is noted. Degenerative changes of lumbar spine are seen. IMPRESSION: Small bowel dilatation.  CT may be helpful for further evaluation. Electronically Signed   By: Alcide Clever M.D.   On: 01/13/2016 18:26   Dg Abd Acute W/chest  Result Date: 01/14/2016 CLINICAL DATA:  Pt came in through ER yesterday with vomiting and pain in abdomen. Follow up SBO. NG tube is placed. EXAM: DG ABDOMEN ACUTE W/ 1V CHEST COMPARISON:  01/13/2016 FINDINGS: Stomach is distended with an air-fluid level despite the nasogastric tube. Tip of the nasogastric tube projects in the distal stomach in the right upper quadrant. There is dilated small bowel similar to the prior CT.  No free air. There is lung base opacity consistent with atelectasis. No convincing pneumonia. Lungs otherwise clear. The esophagus is distended with air. Cardiac silhouette is normal in size. No mediastinal or hilar masses. Skeletal structures are grossly intact. There are degenerative changes of the thoracolumbar spine. IMPRESSION: 1. Persistent small bowel obstruction.  No free air. 2. Stomach is distended despite the presence of the nasogastric tube. There is also distention of the esophagus. 3. Lung base opacity is noted consistent with atelectasis. No convincing pneumonia or pulmonary edema. Electronically Signed   By: Amie Portland M.D.   On: 01/14/2016 08:51    Anti-infectives: Anti-infectives    None      Assessment/Plan:  Labs are reviewed as are the CT films personally and KUB from this morning. While there is no transition point there is definitely dilated bowel proximal  to collapsed bowel. She has not had any surgery and the suggestion that this could be a mechanical small bowel obstruction is very low on the differential diagnostic list. However it must be considered. But of significance is the fact the patient's husband had similar symptoms and thinks they had a stomach virus earlier in the  week. I will reexamine the patient later today and consider surgical intervention should she worsen at any time.  Lattie Hawichard E Paw Karstens, MD, FACS  01/14/2016

## 2016-01-14 NOTE — Progress Notes (Signed)
Visit patient is this afternoon her husband was present She states he feels better this afternoon is had multiple bowel movements but not much flatus she has no nausea or vomiting  Vital signs are stable and reviewed. Awake alert oriented NG in place Abdomen is soft and essentially nontender slightly distended  Patient seems to be improved this afternoon we pulled the NG tube back slightly and she has had fairly good output.  I did discuss with her husband present his early illness earlier this week. He described one half days or so of considerable diarrhea but no nausea or vomiting. He did have some mild abdominal pain and he states that his symptoms were not unlike his wife's.  With all this in mind I would recommend continued observation and reexamination. She has not had surgery so adhesive bowel disease is unlikely and with the proximate similar illness of her husband this is suggestive of an infectious condition.

## 2016-01-14 NOTE — Progress Notes (Signed)
01/14/2016 10:15  Withdrew NG tube 3 inches and flushed with 50cc sterile water as ordered by MD.  Bradly Chrisougherty, Martino Tompson E, RN

## 2016-01-15 ENCOUNTER — Inpatient Hospital Stay: Payer: Medicare HMO

## 2016-01-15 LAB — BASIC METABOLIC PANEL
Anion gap: 8 (ref 5–15)
BUN: 16 mg/dL (ref 6–20)
CO2: 30 mmol/L (ref 22–32)
Calcium: 8.7 mg/dL — ABNORMAL LOW (ref 8.9–10.3)
Chloride: 96 mmol/L — ABNORMAL LOW (ref 101–111)
Creatinine, Ser: 0.72 mg/dL (ref 0.44–1.00)
GFR calc Af Amer: >60 mL/min (ref >60)
GLUCOSE: 177 mg/dL — AB (ref 65–99)
POTASSIUM: 3.2 mmol/L — AB (ref 3.5–5.1)
Sodium: 134 mmol/L — ABNORMAL LOW (ref 135–145)

## 2016-01-15 LAB — CBC
HEMATOCRIT: 39.5 % (ref 35.0–47.0)
Hemoglobin: 14.1 g/dL (ref 12.0–16.0)
MCH: 31.3 pg (ref 26.0–34.0)
MCHC: 35.8 g/dL (ref 32.0–36.0)
MCV: 87.5 fL (ref 80.0–100.0)
Platelets: 242 10*3/uL (ref 150–440)
RBC: 4.51 MIL/uL (ref 3.80–5.20)
RDW: 12.9 % (ref 11.5–14.5)
WBC: 10.6 10*3/uL (ref 3.6–11.0)

## 2016-01-15 NOTE — Care Management Important Message (Signed)
Important Message  Patient Details  Name: Destiny MoutonGlenda Shreffler MRN: [id number] Date of Birth: 06/08/1945   Medicare Important Message Given:  Yes    Chapman FitchBOWEN, Adarsh Mundorf T, RN 01/15/2016, 12:29 PM

## 2016-01-15 NOTE — Progress Notes (Signed)
Pr dr.woodham please remove NG tube.

## 2016-01-15 NOTE — Progress Notes (Signed)
Subjective:   She is feeling much better this evening. She denies any nausea or vomiting. She's not had any further significant abdominal pain. She's been passing gas most of the day and had at least one large bowel movement and 2 smaller bowel movements during the day.  Vital signs in last 24 hours: Temp:  [98.2 F (36.8 C)-99.7 F (37.6 C)] 98.2 F (36.8 C) (08/14 1242) Pulse Rate:  [84-112] 91 (08/14 1711) Resp:  [18-20] 20 (08/14 1242) BP: (141-189)/(53-64) 163/61 (08/14 1711) SpO2:  [96 %-98 %] 97 % (08/14 1242) Last BM Date: 01/13/16  Intake/Output from previous day: 08/13 0701 - 08/14 0700 In: 4288 [I.V.:3988; NG/GT:280; IV Piggyback:20] Out: 3325 [Urine:750; Emesis/NG output:2575]  Exam:  Her abdomen is soft with good bowel sounds minimally tender with no rebound or guarding. She has no significant abdominal distention.  Lab Results:  CBC  Recent Labs  01/14/16 0604 01/15/16 0427  WBC 19.1* 10.6  HGB 15.3 14.1  HCT 44.7 39.5  PLT 258 242   CMP     Component Value Date/Time   NA 134 (L) 01/15/2016 0427   NA 133 (L) 12/18/2015 1221   NA 136 01/08/2014 1137   K 3.2 (L) 01/15/2016 0427   K 4.0 01/08/2014 1137   CL 96 (L) 01/15/2016 0427   CL 102 01/08/2014 1137   CO2 30 01/15/2016 0427   CO2 28 01/08/2014 1137   GLUCOSE 177 (H) 01/15/2016 0427   GLUCOSE 118 (H) 01/08/2014 1137   BUN 16 01/15/2016 0427   BUN 15 12/18/2015 1221   BUN 11 01/08/2014 1137   CREATININE 0.72 01/15/2016 0427   CREATININE 0.71 01/08/2014 1137   CALCIUM 8.7 (L) 01/15/2016 0427   CALCIUM 9.2 01/08/2014 1137   PROT 6.4 (L) 01/14/2016 0604   PROT 6.5 12/18/2015 1221   PROT 7.2 01/08/2014 1137   ALBUMIN 3.3 (L) 01/14/2016 0604   ALBUMIN 4.1 12/18/2015 1221   ALBUMIN 3.4 01/08/2014 1137   AST 22 01/14/2016 0604   AST 16 01/08/2014 1137   ALT 12 (L) 01/14/2016 0604   ALT 23 01/08/2014 1137   ALKPHOS 88 01/14/2016 0604   ALKPHOS 100 01/08/2014 1137   BILITOT 1.2 01/14/2016 0604    BILITOT 1.1 12/18/2015 1221   BILITOT 0.6 01/08/2014 1137   GFRNONAA >60 01/15/2016 0427   GFRNONAA >60 01/08/2014 1137   GFRAA >60 01/15/2016 0427   GFRAA >60 01/08/2014 1137   PT/INR No results for input(s): LABPROT, INR in the last 72 hours.  Studies/Results: Dg Abd 1 View  Result Date: 01/14/2016 CLINICAL DATA:  Nausea and abdominal pain EXAM: ABDOMEN - 1 VIEW COMPARISON:  01/14/2016 FINDINGS: Nasogastric catheter is noted within the stomach which remains somewhat distended with fluid. Chronic degenerative changes are seen in the lumbar spine. Contrast material is noted within the bladder from recent CT examination. Fecal material is noted throughout colon. No obstructive changes are seen. No free air is noted. IMPRESSION: Nasogastric catheter in the stomach. It remains somewhat distended with fluid although improved from the prior exam. Electronically Signed   By: Alcide Clever M.D.   On: 01/14/2016 19:25   Dg Abd 1 View  Result Date: 01/13/2016 CLINICAL DATA:  Nasogastric tube placement.  Initial encounter. EXAM: ABDOMEN - 1 VIEW COMPARISON:  CT of the abdomen and pelvis performed earlier today at 8:16 p.m. FINDINGS: The patient's enteric tube is seen ending overlying the antrum of the stomach. There is persistent distention of small-bowel loops, concerning for  bowel obstruction. The small bowel loops demonstrate an unusual amount of inflammation on CT, without definite bowel ischemia, though impending ischemic injury cannot be entirely excluded. Contrast is seen within the renal calyces and within the bladder. No free intra-abdominal air is seen, though evaluation for free air is limited on supine views. No acute osseous abnormalities are identified. IMPRESSION: Enteric tube seen ending overlying the antrum of the stomach. Persistent distention of small-bowel loops, concerning for bowel obstruction. Unusual amount of inflammation noted with regard to the small bowel loops on CT.  Electronically Signed   By: Roanna RaiderJeffery  Chang M.D.   On: 01/13/2016 21:44   Ct Abdomen Pelvis W Contrast  Result Date: 01/13/2016 CLINICAL DATA:  Epigastric and left upper quadrant abdominal pain since last night. Nausea. Small bowel dilatation on recent abdomen radiographs. EXAM: CT ABDOMEN AND PELVIS WITH CONTRAST TECHNIQUE: Multidetector CT imaging of the abdomen and pelvis was performed using the standard protocol following bolus administration of intravenous contrast. CONTRAST:  80mL ISOVUE-300 IOPAMIDOL (ISOVUE-300) INJECTION 61% COMPARISON:  Abdomen radiographs obtained earlier today. FINDINGS: Lower chest:  Mild dependent atelectasis at both lung bases. Hepatobiliary: Multiple gallstones in the gallbladder, measuring up to 12 mm in maximum diameter each. No gallbladder wall thickening or pericholecystic fluid. Small, normal appearing liver. Pancreas: No mass, inflammatory changes, or other significant abnormality. Spleen: Small splenic cyst. Adrenals/Urinary Tract: Tiny amount of air in the urinary bladder, compatible with recent catheterization. No urinary tract masses, calculi or hydronephrosis seen. Tiny probable cysts in the cortex of both kidneys. Normal appearing adrenal glands. Stomach/Bowel: Markedly dilated stomach. Multiple dilated loops of jejunum with normal caliber distal small bowel. There is a gradual transition to normal caliber small bowel in the right lower abdomen. The dilated loops contain feculent appearing material. There is mesenteric fluid adjacent to the most dilated jejunum in the left mid abdomen and in the midline lower abdomen. No bowel wall thickening or pneumatosis seen. No evidence of appendicitis. Multiple sigmoid colon diverticula without evidence of diverticulitis. Vascular/Lymphatic: Atheromatous arterial calcifications, including the abdominal aorta. No vascular occlusions seen. No enlarged lymph nodes. Reproductive: No mass or other significant abnormality. Other: In  addition to the free peritoneal fluid in the mesentery, there is a small amount of free peritoneal fluid in the inferior pelvis in the cul-de-sac. No free peritoneal air is seen. Musculoskeletal: Marked lumbar and lower thoracic spine degenerative changes. No fractures or pars defects. IMPRESSION: 1. Small bowel obstruction with a gradual transition to normal caliber small bowel in the right mid to lower abdomen. There is associated fecalization of small bowel contents and marked gastric dilatation. No mass or stricture visualized. 2. Associated mesenteric fluid and small amount of free peritoneal fluid. 3. Sigmoid diverticulosis. 4. Cholelithiasis. 5. Aortic atherosclerosis. Electronically Signed   By: Beckie SaltsSteven  Reid M.D.   On: 01/13/2016 20:39   Dg Abd 2 Views  Result Date: 01/13/2016 CLINICAL DATA:  Nausea and left upper abdomen pain EXAM: ABDOMEN - 2 VIEW COMPARISON:  None. FINDINGS: Scattered large and small bowel gas is noted. Mild dilatation of the small bowel is noted in left mid abdomen and pelvis. No free air is seen. No abnormal mass is noted. Degenerative changes of lumbar spine are seen. IMPRESSION: Small bowel dilatation.  CT may be helpful for further evaluation. Electronically Signed   By: Alcide CleverMark  Lukens M.D.   On: 01/13/2016 18:26   Dg Abd Acute W/chest  Result Date: 01/15/2016 CLINICAL DATA:  Small bowel obstruction. EXAM: DG ABDOMEN ACUTE  W/ 1V CHEST COMPARISON:  01/14/2016 FINDINGS: Nasogastric tube remains in the stomach. There remains gastric distention. Overall improving partial small bowel obstruction. No free air identified. Lungs show bibasilar atelectasis. No edema, consolidation, pleural fluid or pneumothorax. IMPRESSION: Improving partial small bowel obstruction. There remains gastric distension after nasogastric tube placement. Electronically Signed   By: Irish LackGlenn  Yamagata M.D.   On: 01/15/2016 08:14   Dg Abd Acute W/chest  Result Date: 01/14/2016 CLINICAL DATA:  Pt came in  through ER yesterday with vomiting and pain in abdomen. Follow up SBO. NG tube is placed. EXAM: DG ABDOMEN ACUTE W/ 1V CHEST COMPARISON:  01/13/2016 FINDINGS: Stomach is distended with an air-fluid level despite the nasogastric tube. Tip of the nasogastric tube projects in the distal stomach in the right upper quadrant. There is dilated small bowel similar to the prior CT.  No free air. There is lung base opacity consistent with atelectasis. No convincing pneumonia. Lungs otherwise clear. The esophagus is distended with air. Cardiac silhouette is normal in size. No mediastinal or hilar masses. Skeletal structures are grossly intact. There are degenerative changes of the thoracolumbar spine. IMPRESSION: 1. Persistent small bowel obstruction.  No free air. 2. Stomach is distended despite the presence of the nasogastric tube. There is also distention of the esophagus. 3. Lung base opacity is noted consistent with atelectasis. No convincing pneumonia or pulmonary edema. Electronically Signed   By: Amie Portlandavid  Ormond M.D.   On: 01/14/2016 08:51    Assessment/Plan: I independently reviewed her CT scan. I do not see any evidence of significant obstruction at the present time. We will repeat her plain films tomorrow and she is improving clinically we'll discontinue her nasogastric tube and start her liquid diet. She is in agreement with this plan. Her husband was present for the interview.

## 2016-01-16 ENCOUNTER — Telehealth: Payer: Self-pay | Admitting: Family Medicine

## 2016-01-16 ENCOUNTER — Ambulatory Visit: Payer: Self-pay | Admitting: Family Medicine

## 2016-01-16 ENCOUNTER — Inpatient Hospital Stay: Payer: Medicare HMO

## 2016-01-16 MED ORDER — PANTOPRAZOLE SODIUM 40 MG PO TBEC
40.0000 mg | DELAYED_RELEASE_TABLET | Freq: Two times a day (BID) | ORAL | Status: DC
  Administered 2016-01-16: 40 mg via ORAL
  Filled 2016-01-16: qty 1

## 2016-01-16 NOTE — Care Management Important Message (Signed)
Important Message  Patient Details  Name: Destiny Torres MRN: [id number] Date of Birth: 12/29/1945   Medicare Important Message Given:  Yes    Chapman FitchBOWEN, Emmalee Solivan T, RN 01/16/2016, 2:50 PM

## 2016-01-16 NOTE — Discharge Summary (Signed)
Patient ID: Destiny MoutonGlenda Swigert MRN: [id number] DOB/AGE: 72/06/1945 70 y.o.  Admit date: 01/13/2016 Discharge date: 01/16/2016  Discharge Diagnoses:  Abdominal pain possible gastroenteritis  Procedures Performed: None  Discharged Condition: good  Hospital Course: She was admitted to the hospital early on the morning of 01/14/2016. She had abdominal pain nausea with some vomiting. She's not had a bowel movement in a couple of days. CT scan suggested possible small bowel obstruction although she did not have a clear-cut etiology. She was admitted the hospital for further evaluation. Nasogastric tube was placed for decompression and her symptoms resolved almost immediately. Within 24 hours she was having flatus and began to have bowel function today. She's able tolerate a regular diet. She discharged home this evening to be found the office of her primary care physician as necessary. Follow-up x-rays did not demonstrate any evidence of obstruction. Discharge Orders: Discharge Instructions    Diet - low sodium heart healthy    Complete by:  As directed   Increase activity slowly    Complete by:  As directed      Disposition: Final discharge disposition not confirmed  Discharge Medications:  Current Facility-Administered Medications:  .  diphenhydrAMINE (BENADRYL) 12.5 MG/5ML elixir 12.5 mg, 12.5 mg, Oral, Q6H PRN **OR** diphenhydrAMINE (BENADRYL) injection 12.5 mg, 12.5 mg, Intravenous, Q6H PRN, Diego F Pabon, MD .  enoxaparin (LOVENOX) injection 40 mg, 40 mg, Subcutaneous, Q24H, Diego F Pabon, MD, 40 mg at 01/15/16 2057 .  hydrALAZINE (APRESOLINE) injection 10 mg, 10 mg, Intravenous, Q2H PRN, Leafy Roiego F Pabon, MD, 10 mg at 01/14/16 2156 .  metoprolol (LOPRESSOR) injection 5 mg, 5 mg, Intravenous, Q6H, Arnaldo NatalMichael S Diamond, MD, 5 mg at 01/16/16 1303 .  morphine 2 MG/ML injection 2 mg, 2 mg, Intravenous, Q2H PRN, Leafy Roiego F Pabon, MD, 2 mg at 01/15/16 2104 .  ondansetron (ZOFRAN-ODT) disintegrating tablet  4 mg, 4 mg, Oral, Q6H PRN **OR** ondansetron (ZOFRAN) injection 4 mg, 4 mg, Intravenous, Q6H PRN, Diego F Pabon, MD .  pantoprazole (PROTONIX) EC tablet 40 mg, 40 mg, Oral, BID, Tiney Rougealph Ely III, MD, 40 mg at 01/16/16 1302  [place]: Follow-up Information    [facility] [place] .   Specialty:  Family Medicine Contact information: [address] [place] [id number] [phone]           Signed: Tiney RougeRalph Ely III 01/16/2016, 4:01 PM

## 2016-01-16 NOTE — Progress Notes (Signed)
Subjective:   She is feeling much better this morning. She denies any abdominal pain or nausea. She continues to have fairly large bowel movements. Overall she feels as though she is making great progress. She is tolerating liquids.  Vital signs in last 24 hours: Temp:  [98 F (36.7 C)-98.5 F (36.9 C)] 98 F (36.7 C) (08/15 0512) Pulse Rate:  [84-104] 99 (08/15 0512) Resp:  [18-20] 19 (08/15 0512) BP: (144-175)/(51-61) 151/51 (08/15 0512) SpO2:  [94 %-98 %] 97 % (08/15 0512) Last BM Date: 01/15/16  Intake/Output from previous day: 08/14 0701 - 08/15 0700 In: 3933.5 [P.O.:560; I.V.:3073.5; NG/GT:250; IV Piggyback:50] Out: 2700 [Urine:2700]  Exam:  Her abdomen is benign. She has no significant abdominal tenderness no rebound and guarding with good bowel sounds. Her lungs are clear and she has no wheezing or shortness of breath.  Lab Results:  CBC  Recent Labs  01/14/16 0604 01/15/16 0427  WBC 19.1* 10.6  HGB 15.3 14.1  HCT 44.7 39.5  PLT 258 242   CMP     Component Value Date/Time   NA 134 (L) 01/15/2016 0427   NA 133 (L) 12/18/2015 1221   NA 136 01/08/2014 1137   K 3.2 (L) 01/15/2016 0427   K 4.0 01/08/2014 1137   CL 96 (L) 01/15/2016 0427   CL 102 01/08/2014 1137   CO2 30 01/15/2016 0427   CO2 28 01/08/2014 1137   GLUCOSE 177 (H) 01/15/2016 0427   GLUCOSE 118 (H) 01/08/2014 1137   BUN 16 01/15/2016 0427   BUN 15 12/18/2015 1221   BUN 11 01/08/2014 1137   CREATININE 0.72 01/15/2016 0427   CREATININE 0.71 01/08/2014 1137   CALCIUM 8.7 (L) 01/15/2016 0427   CALCIUM 9.2 01/08/2014 1137   PROT 6.4 (L) 01/14/2016 0604   PROT 6.5 12/18/2015 1221   PROT 7.2 01/08/2014 1137   ALBUMIN 3.3 (L) 01/14/2016 0604   ALBUMIN 4.1 12/18/2015 1221   ALBUMIN 3.4 01/08/2014 1137   AST 22 01/14/2016 0604   AST 16 01/08/2014 1137   ALT 12 (L) 01/14/2016 0604   ALT 23 01/08/2014 1137   ALKPHOS 88 01/14/2016 0604   ALKPHOS 100 01/08/2014 1137   BILITOT 1.2 01/14/2016 0604   BILITOT 1.1 12/18/2015 1221   BILITOT 0.6 01/08/2014 1137   GFRNONAA >60 01/15/2016 0427   GFRNONAA >60 01/08/2014 1137   GFRAA >60 01/15/2016 0427   GFRAA >60 01/08/2014 1137   PT/INR No results for input(s): LABPROT, INR in the last 72 hours.  Studies/Results: Dg Abd 1 View  Result Date: 01/14/2016 CLINICAL DATA:  Nausea and abdominal pain EXAM: ABDOMEN - 1 VIEW COMPARISON:  01/14/2016 FINDINGS: Nasogastric catheter is noted within the stomach which remains somewhat distended with fluid. Chronic degenerative changes are seen in the lumbar spine. Contrast material is noted within the bladder from recent CT examination. Fecal material is noted throughout colon. No obstructive changes are seen. No free air is noted. IMPRESSION: Nasogastric catheter in the stomach. It remains somewhat distended with fluid although improved from the prior exam. Electronically Signed   By: Alcide CleverMark  Lukens M.D.   On: 01/14/2016 19:25   Dg Abd Acute W/chest  Result Date: 01/15/2016 CLINICAL DATA:  Small bowel obstruction. EXAM: DG ABDOMEN ACUTE W/ 1V CHEST COMPARISON:  01/14/2016 FINDINGS: Nasogastric tube remains in the stomach. There remains gastric distention. Overall improving partial small bowel obstruction. No free air identified. Lungs show bibasilar atelectasis. No edema, consolidation, pleural fluid or pneumothorax. IMPRESSION: Improving partial small  bowel obstruction. There remains gastric distension after nasogastric tube placement. Electronically Signed   By: Irish LackGlenn  Yamagata M.D.   On: 01/15/2016 08:14    Assessment/Plan: She has improved over the last 24 hours. She appears to have normal bowel function at this time. We will advance her diet and increase her activity and consider discharge later today.

## 2016-01-16 NOTE — Telephone Encounter (Signed)
Pt is being discharged [facility] today for small bowel obstruction.   I have scheduled a follow up appointment/MW

## 2016-01-16 NOTE — Progress Notes (Signed)
01/16/2016 4:39 PM  BP (!) 146/69 (BP Location: Left Arm)   Pulse 93   Temp 98 F (36.7 C) (Oral)   Resp 19   Ht 5\' 2"  (1.575 m)   Wt 54.4 kg (120 lb)   SpO2 97%   BMI 21.95 kg/m  Patient discharged per MD orders. Discharge instructions reviewed with patient and patient verbalized understanding. IV removed per policy. Marland Kitchen. Discharged via wheelchair escorted by nursing staff.  Ron ParkerHerron, Donyell Carrell D, RN

## 2016-01-17 NOTE — Telephone Encounter (Signed)
Patient and patient's husband advised as directed below. Patient is aware of hospital follow up appointment.   Thanks, [employer]gate-Palmoliveikki

## 2016-01-17 NOTE — Telephone Encounter (Signed)
No unfortunately you are not to chew or crush an extended release tablet due to causing too much medication to be released all at once. If it is scored in the middle you can cut in half to see if that helps to take it. Can still be put into applesauce just cannot chew it up, have to swallow half or whole.

## 2016-01-17 NOTE — Telephone Encounter (Signed)
Pt called back 8/16.  She needs to take her metoprolol but she can not swallow and wants to know if this pill can be crushed and put in something.  Thanks Barth Kirkseri

## 2016-01-22 ENCOUNTER — Emergency Department
Admission: EM | Admit: 2016-01-22 | Discharge: 2016-01-22 | Disposition: A | Discharge status: Home / Self Care | Payer: Medicare HMO | Attending: Emergency Medicine | Admitting: Emergency Medicine

## 2016-01-22 ENCOUNTER — Telehealth: Payer: Self-pay

## 2016-01-22 ENCOUNTER — Emergency Department: Payer: Medicare HMO

## 2016-01-22 DIAGNOSIS — I10 Essential (primary) hypertension: Secondary | ICD-10-CM | POA: Insufficient documentation

## 2016-01-22 DIAGNOSIS — R7989 Other specified abnormal findings of blood chemistry: Secondary | ICD-10-CM | POA: Insufficient documentation

## 2016-01-22 DIAGNOSIS — R778 Other specified abnormalities of plasma proteins: Secondary | ICD-10-CM

## 2016-01-22 DIAGNOSIS — R002 Palpitations: Secondary | ICD-10-CM | POA: Diagnosis not present

## 2016-01-22 DIAGNOSIS — J90 Pleural effusion, not elsewhere classified: Secondary | ICD-10-CM | POA: Diagnosis not present

## 2016-01-22 DIAGNOSIS — J9811 Atelectasis: Secondary | ICD-10-CM | POA: Diagnosis not present

## 2016-01-22 LAB — CBC
HEMATOCRIT: 35.7 % (ref 35.0–47.0)
HEMOGLOBIN: 12.6 g/dL (ref 12.0–16.0)
MCH: 30.3 pg (ref 26.0–34.0)
MCHC: 35.2 g/dL (ref 32.0–36.0)
MCV: 86.2 fL (ref 80.0–100.0)
Platelets: 359 10*3/uL (ref 150–440)
RBC: 4.14 MIL/uL (ref 3.80–5.20)
RDW: 12.6 % (ref 11.5–14.5)
WBC: 10.9 10*3/uL (ref 3.6–11.0)

## 2016-01-22 LAB — BASIC METABOLIC PANEL
ANION GAP: 7 (ref 5–15)
BUN: 7 mg/dL (ref 6–20)
CALCIUM: 8.2 mg/dL — AB (ref 8.9–10.3)
CO2: 29 mmol/L (ref 22–32)
Chloride: 99 mmol/L — ABNORMAL LOW (ref 101–111)
Creatinine, Ser: 0.6 mg/dL (ref 0.44–1.00)
Glucose, Bld: 149 mg/dL — ABNORMAL HIGH (ref 65–99)
POTASSIUM: 2.8 mmol/L — AB (ref 3.5–5.1)
SODIUM: 135 mmol/L (ref 135–145)

## 2016-01-22 LAB — TROPONIN I
TROPONIN I: 0.07 ng/mL — AB (ref <0.03)
TROPONIN I: 0.06 ng/mL — AB (ref <0.03)

## 2016-01-22 MED ORDER — POTASSIUM CHLORIDE CRYS ER 20 MEQ PO TBCR
40.0000 meq | EXTENDED_RELEASE_TABLET | Freq: Once | ORAL | Status: AC
  Administered 2016-01-22: 40 meq via ORAL
  Filled 2016-01-22: qty 2

## 2016-01-22 MED ORDER — ASPIRIN EC 325 MG PO TBEC
325.0000 mg | DELAYED_RELEASE_TABLET | Freq: Once | ORAL | Status: AC
  Administered 2016-01-22: 325 mg via ORAL
  Filled 2016-01-22: qty 1

## 2016-01-22 MED ORDER — ASPIRIN 81 MG PO TBEC: 81.0000 mg | DELAYED_RELEASE_TABLET | Freq: Every day | ORAL | 12 refills | Status: AC

## 2016-01-22 NOTE — ED Notes (Signed)
Unplugged wires and assisted patient with ambulating to bedside commode.

## 2016-01-22 NOTE — ED Provider Notes (Signed)
[facility] Emergency Department Provider Note        Time seen: ----------------------------------------- 10:43 AM on 01/22/2016 -----------------------------------------    I have reviewed the triage vital signs and the nursing notes.   HISTORY  Chief Complaint Palpitations    HPI Destiny Torres is a 70 y.o. female who presents ER for dizziness with irregular heartbeat lasting less than a minute this morning. She was told to come here for further evaluation by her primary care doctor. She was recently admitted to the hospital by me for bowel obstruction, states she's having normal BMs currently.Patient states she has a brief episode were heart was racing and irregular. She did not have any chest pain associated with this. Patient has had some weakness and mild shortness of breath since leaving the hospital. She has had some abdominal pain but has moved her bowels this morning.   Past Medical History:  Diagnosis Date  . Arthritis   . GERD (gastroesophageal reflux disease)   . Hypertension     Patient Active Problem List   Diagnosis Date Noted  . Epigastric pain   . SBO (small bowel obstruction) (HCC) 01/13/2016  . Tremor of both hands 10/03/2015  . Arthritis 01/02/2015  . Acid reflux 01/02/2015  . Benign hypertension 01/02/2015  . Dizziness 01/02/2015  . Essential (primary) hypertension 01/02/2015  . Gastro-esophageal reflux disease without esophagitis 01/02/2015  . 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 HAND SURGERY Right 04/2014  . TONSILLECTOMY      Allergies Penicillins and Streptomycin  Social History Social History  Substance Use Topics  . Smoking status: Never Smoker  . Smokeless tobacco: Never Used  . Alcohol use No    Review of Systems Constitutional: Negative for fever. Cardiovascular: Negative for chest pain.Positive for palpitations Respiratory: Positive shortness of  breath Gastrointestinal: Negative for abdominal pain, vomiting and diarrhea. Genitourinary: Negative for dysuria. Musculoskeletal: Negative for back pain. Skin: Negative for rash. Neurological: Negative for headaches, focal weakness or numbness.  10-point ROS otherwise negative.  ____________________________________________   PHYSICAL EXAM:  VITAL SIGNS: ED Triage Vitals  Enc Vitals Group     BP 01/22/16 0930 (!) 163/71     Pulse Rate 01/22/16 0930 80     Resp 01/22/16 0930 17     Temp 01/22/16 0930 98.1 F (36.7 C)     Temp Source 01/22/16 0930 Oral     SpO2 01/22/16 0930 98 %     Weight 01/22/16 0931 120 lb (54.4 kg)     Height 01/22/16 0931 5\' 2"  (1.575 m)     Head Circumference --      Peak Flow --      Pain Score 01/22/16 0931 0     Pain Loc --      Pain Edu? --      Excl. in GC? --     Constitutional: Alert and oriented. Well appearing and in no distress. Eyes: Conjunctivae are normal. PERRL. Normal extraocular movements. ENT   Head: Normocephalic and atraumatic.   Nose: No congestion/rhinnorhea.   Mouth/Throat: Mucous membranes are moist.   Neck: No stridor. Cardiovascular: Normal rate, regular rhythm. No murmurs, rubs, or gallops. Respiratory: Normal respiratory effort without tachypnea nor retractions. Breath sounds are clear and equal bilaterally. No wheezes/rales/rhonchi. Gastrointestinal: Soft and nontender. Normal bowel sounds Musculoskeletal: Nontender with normal range of motion in all extremities. No lower extremity tenderness nor edema. Neurologic:  Normal speech and language. No gross  focal neurologic deficits are appreciated.  Skin:  Skin is warm, dry and intact. No rash noted. Psychiatric: Mood and affect are normal. Speech and behavior are normal.  ____________________________________________  EKG: Interpreted by me.Sinus rhythm rate 82 bpm, normal PR interval, normal QRS, normal QT interval. PVC, possible septal infarct age  indeterminate.  ____________________________________________  ED COURSE:  Pertinent labs & imaging results that were available during my care of the patient were reviewed by me and considered in my medical decision making (see chart for details). Clinical Course   Patient presents to the ER for palpitations of uncertain etiology. We will check basic labs and imaging if needed.  Procedures ____________________________________________   LABS (pertinent positives/negatives)  Labs Reviewed  BASIC METABOLIC PANEL - Abnormal; Notable for the following:       Result Value   Potassium 2.8 (*)    Chloride 99 (*)    Glucose, Bld 149 (*)    Calcium 8.2 (*)    All other components within normal limits  TROPONIN I - Abnormal; Notable for the following:    Troponin I 0.07 (*)    All other components within normal limits  TROPONIN I - Abnormal; Notable for the following:    Troponin I 0.06 (*)    All other components within normal limits  CBC    RADIOLOGY  Chest x-ray IMPRESSION: Bilateral moderate in size pleural effusions with subsegmental bibasilar atelectasis.  Mildly enlarged cardiac silhouette.   ____________________________________________  FINAL ASSESSMENT AND PLAN  Palpitations, elevated troponin  Plan: Patient with labs and imaging as dictated above. Patient is asymptomatic at this time, repeat troponin is improving. Have discussed case with Dr. Gwen PoundsKowalski who recommends outpatient follow-up. She will continue on a beta blocker, she will continue on enteric-coated aspirin. She is stable for outpatient follow-up.   Emily FilbertWilliams, Tahra Hitzeman E, MD   Note: This dictation was prepared with Dragon dictation. Any transcriptional errors that result from this process are unintentional    Emily FilbertJonathan E Cleota Pellerito, MD 01/22/16 1244

## 2016-01-22 NOTE — Telephone Encounter (Signed)
   Pt called today complaining of abdominal pain, heart palpitations, and shortness of breath.  She was just recently in the hospital for a bowel obstruction.    I advised her that she would need to go back to the hospital to be further evaluated.  She then said her symptoms "Where not that bad and maybe I would feel better after I eat something."  I told her it was not in her best interest to "wait and see what happens."  She said she would think about it.  I told her it was against medical advise to ignore her symptoms and reported she understood and then ended the call.    Thanks,   -Vernona RiegerLaura

## 2016-01-22 NOTE — ED Notes (Signed)
Troponin 0.07 Dr Mayford KnifeWilliams notified

## 2016-01-22 NOTE — ED Triage Notes (Signed)
Pt c/o having an episode of dizziness with irregular heart beat last less then a minute this morning and was referred by PCP to come to the ED for eval,.,. Pt was admitted last week with bowel obstruction, states she is having normal BM..Marland Kitchen

## 2016-01-24 DIAGNOSIS — I10 Essential (primary) hypertension: Secondary | ICD-10-CM | POA: Diagnosis not present

## 2016-01-24 DIAGNOSIS — R002 Palpitations: Secondary | ICD-10-CM | POA: Insufficient documentation

## 2016-01-24 DIAGNOSIS — R0602 Shortness of breath: Secondary | ICD-10-CM | POA: Insufficient documentation

## 2016-01-29 ENCOUNTER — Inpatient Hospital Stay: Payer: Medicare HMO | Admitting: Family Medicine

## 2016-01-30 ENCOUNTER — Encounter: Payer: Self-pay | Admitting: Family Medicine

## 2016-01-30 ENCOUNTER — Ambulatory Visit (INDEPENDENT_AMBULATORY_CARE_PROVIDER_SITE_OTHER): Payer: Medicare HMO | Admitting: Family Medicine

## 2016-01-30 VITALS — BP 140/68 | HR 62 | Temp 98.8°F | Resp 14 | Wt 112.0 lb

## 2016-01-30 DIAGNOSIS — E876 Hypokalemia: Secondary | ICD-10-CM | POA: Diagnosis not present

## 2016-01-30 DIAGNOSIS — K8020 Calculus of gallbladder without cholecystitis without obstruction: Secondary | ICD-10-CM

## 2016-01-30 DIAGNOSIS — Z87898 Personal history of other specified conditions: Secondary | ICD-10-CM

## 2016-01-30 DIAGNOSIS — Z8679 Personal history of other diseases of the circulatory system: Secondary | ICD-10-CM

## 2016-01-30 DIAGNOSIS — K56609 Unspecified intestinal obstruction, unspecified as to partial versus complete obstruction: Secondary | ICD-10-CM

## 2016-01-30 DIAGNOSIS — K5669 Other intestinal obstruction: Secondary | ICD-10-CM | POA: Diagnosis not present

## 2016-01-30 NOTE — Progress Notes (Signed)
Patient: Destiny MoutonGlenda Corpening Female    DOB: 01/17/1946   70 y.o.   MRN: [id number] Visit Date: 01/30/2016  Today's Provider: Dortha Kernennis Makyia Erxleben, PA   Chief Complaint  Patient presents with  . Hospitalization Follow-up   Subjective:    HPI  Follow up Hospitalization  [facility]/2016 and discharged on 01/16/2016. She was admitted to the hospital early on the morning of 01/14/2016. She had abdominal pain nausea with some vomiting. She's not had a bowel movement in a couple of days. CT scan suggested possible small bowel obstruction although she did not have a clear-cut etiology. Also, showed cholelithiasis. She was treated for small bowel obstruction. Treatment for this included low sodium diet and increase activities slowly. She reports good compliance with treatment. She reports this condition is Improved.  ------------------------------------------------------------------------------------   Follow up Hospitalization  Patient was [facility] ER on 01/22/2016 and discharged on 01/22/2016. Went in to be evaluated for dizziness and irregular heartbeat lasting less than a minute. EKG showed sinus rhythm rate 82 bpm, normal PR interval, normal QRS, normal QT interval. PVC, possible septal infarct age indeterminate.  She was treated for palpations and elevated troponin. Treatment for this included continue on a beta blocker, and continue on enteric-coated aspirin. Was evaluated by Dr. Gwen PoundsKowalski in outpatient clinic with 24 Hr. Holter Monitor. She reports good compliance with treatment. She reports this condition is Improved.  Past Medical History:  Diagnosis Date  . Arthritis   . GERD (gastroesophageal reflux disease)   . Hypertension    Past Surgical History:  Procedure Laterality Date  . HAND SURGERY Right 04/2014  . TONSILLECTOMY     Family History  Problem Relation Age of Onset  . Heart disease Mother   . Arthritis Mother   . Heart disease Father   .  Stroke Father    Allergies  Allergen Reactions  . Penicillins     patient unsure of reaction  . Streptomycin Rash   Previous Medications   ASPIRIN 81 MG EC TABLET    Take 1 tablet (81 mg total) by mouth daily. Swallow whole.   CHOLECALCIFEROL (VITAMIN D) 1000 UNITS TABLET    Take 1,000 Units by mouth daily.   MAGNESIUM 500 MG CAPS    Take by mouth.   METOPROLOL SUCCINATE (TOPROL-XL) 50 MG 24 HR TABLET    Take 1 tablet (50 mg total) by mouth daily. Take with or immediately following a meal.   MULTIPLE VITAMIN (MULTI-VITAMINS) TABS    Take by mouth.   NAPROXEN (NAPROSYN) 500 MG TABLET    Take 1 tablet (500 mg total) by mouth 2 (two) times daily with a meal.   VITAMIN C (ASCORBIC ACID) 500 MG TABLET    Take by mouth.   Review of Systems  Constitutional: Negative.   Respiratory: Negative.   Cardiovascular: Negative.   Gastrointestinal: Positive for abdominal pain (some days).   Social History  Substance Use Topics  . Smoking status: Never Smoker  . Smokeless tobacco: Never Used  . Alcohol use No   Objective:   BP 140/68 (BP Location: Right Arm, Patient Position: Sitting, Cuff Size: Normal)   Pulse 62   Temp 98.8 F (37.1 C) (Oral)   Resp 14   Wt 112 lb (50.8 kg)   BMI 20.49 kg/m  Wt Readings from Last 3 Encounters:  01/30/16 112 lb (50.8 kg)  01/22/16 120 lb (54.4 kg)  01/13/16 120 lb (54.4 kg)    Physical  Exam  Constitutional: She is oriented to person, place, and time. She appears well-developed and well-nourished. No distress.  HENT:  Head: Normocephalic and atraumatic.  Right Ear: Hearing normal.  Left Ear: Hearing normal.  Nose: Nose normal.  Eyes: Conjunctivae and lids are normal. Right eye exhibits no discharge. Left eye exhibits no discharge. No scleral icterus.  Cardiovascular: Normal rate and regular rhythm.   Pulmonary/Chest: Effort normal and breath sounds normal. No respiratory distress.  Abdominal: Soft. Bowel sounds are normal. There is no tenderness.  There is no rebound and no guarding.  Neurological: She is alert and oriented to person, place, and time.  Skin: Skin is intact. No lesion and no rash noted.  Psychiatric: She has a normal mood and affect. Her speech is normal and behavior is normal. Thought content normal.      Assessment & Plan:     1. SBO (small bowel obstruction) (HCC) Back to normal BM's without significant abdominal discomfort since hospitalization and decompression by NG tube. Eating small meals without discomfort. Normal sufficient BM today. No vomiting. Will recheck labs and follow up pending reports. - Comprehensive metabolic panel - CBC with Differential/Platelet  2. Gallstones Multiple non-obstructing gallstones on CT scan of 01-13-16. Recommend low fat diet and recheck labs. May need surgical or gastroenterology referral. Prefers no surgical intervention. - Comprehensive metabolic panel - CBC with Differential/Platelet  3. History of palpitations Minimal palpitations since going to the ER on 01-22-16. Has had Holter Monitor per Dr. Gwen Pounds and scheduled for echocardiogram on 02-14-16.  4. Hypokalemia K+ was 2.8 during ER visit on 01-22-16. Took the KCL supplement but still having general malaise and poor appetite. No abdominal pain or tenderness. Will get follow up labs and continue low fat diet with potassium rich foods. - Comprehensive metabolic panel

## 2016-01-31 DIAGNOSIS — M438X9 Other specified deforming dorsopathies, site unspecified: Secondary | ICD-10-CM | POA: Diagnosis not present

## 2016-01-31 DIAGNOSIS — M545 Low back pain: Secondary | ICD-10-CM | POA: Diagnosis not present

## 2016-01-31 LAB — CBC WITH DIFFERENTIAL/PLATELET
BASOS ABS: 0 10*3/uL (ref 0.0–0.2)
BASOS: 0 %
EOS (ABSOLUTE): 0.1 10*3/uL (ref 0.0–0.4)
Eos: 1 %
Hematocrit: 37.6 % (ref 34.0–46.6)
Hemoglobin: 12.6 g/dL (ref 11.1–15.9)
Immature Grans (Abs): 0.1 10*3/uL (ref 0.0–0.1)
Immature Granulocytes: 1 %
LYMPHS ABS: 3.2 10*3/uL — AB (ref 0.7–3.1)
Lymphs: 36 %
MCH: 29.2 pg (ref 26.6–33.0)
MCHC: 33.5 g/dL (ref 31.5–35.7)
MCV: 87 fL (ref 79–97)
MONOS ABS: 1 10*3/uL — AB (ref 0.1–0.9)
Monocytes: 11 %
NEUTROS ABS: 4.6 10*3/uL (ref 1.4–7.0)
Neutrophils: 51 %
PLATELETS: 437 10*3/uL — AB (ref 150–379)
RBC: 4.31 x10E6/uL (ref 3.77–5.28)
RDW: 12.9 % (ref 12.3–15.4)
WBC: 8.9 10*3/uL (ref 3.4–10.8)

## 2016-01-31 LAB — COMPREHENSIVE METABOLIC PANEL
A/G RATIO: 1.3 (ref 1.2–2.2)
ALT: 12 IU/L (ref 0–32)
AST: 13 IU/L (ref 0–40)
Albumin: 3.6 g/dL (ref 3.5–4.8)
Alkaline Phosphatase: 92 IU/L (ref 39–117)
BILIRUBIN TOTAL: 0.4 mg/dL (ref 0.0–1.2)
BUN/Creatinine Ratio: 20 (ref 12–28)
BUN: 13 mg/dL (ref 8–27)
CALCIUM: 9.2 mg/dL (ref 8.7–10.3)
CHLORIDE: 99 mmol/L (ref 96–106)
CO2: 25 mmol/L (ref 18–29)
Creatinine, Ser: 0.64 mg/dL (ref 0.57–1.00)
GFR, EST AFRICAN AMERICAN: 103 mL/min/{1.73_m2} (ref >59)
GFR, EST NON AFRICAN AMERICAN: 89 mL/min/{1.73_m2} (ref >59)
GLOBULIN, TOTAL: 2.8 g/dL (ref 1.5–4.5)
Glucose: 111 mg/dL — ABNORMAL HIGH (ref 65–99)
POTASSIUM: 4.6 mmol/L (ref 3.5–5.2)
SODIUM: 138 mmol/L (ref 134–144)
TOTAL PROTEIN: 6.4 g/dL (ref 6.0–8.5)

## 2016-02-01 ENCOUNTER — Telehealth: Payer: Self-pay

## 2016-02-01 DIAGNOSIS — K8020 Calculus of gallbladder without cholecystitis without obstruction: Secondary | ICD-10-CM

## 2016-02-01 NOTE — Telephone Encounter (Signed)
Patient advised. Patient agrees to proceed with general surgeon referral. Patient states she is not having surgery though.

## 2016-02-01 NOTE — Telephone Encounter (Signed)
-----   Message from Tamsen Roersennis E Chrismon, [place] sent at 02/01/2016  8:21 AM EDT ----- Potassium back to normal and all other tests essentially normal. Still recommend evaluation by general surgeon regarding gallstones before the become symptomatic.

## 2016-02-03 ENCOUNTER — Emergency Department: Payer: Medicare HMO

## 2016-02-03 ENCOUNTER — Encounter: Payer: Self-pay | Admitting: Emergency Medicine

## 2016-02-03 ENCOUNTER — Emergency Department
Admission: EM | Admit: 2016-02-03 | Discharge: 2016-02-03 | Disposition: A | Discharge status: Home / Self Care | Payer: Medicare HMO | Attending: Emergency Medicine | Admitting: Emergency Medicine

## 2016-02-03 DIAGNOSIS — I10 Essential (primary) hypertension: Secondary | ICD-10-CM | POA: Insufficient documentation

## 2016-02-03 DIAGNOSIS — K5660 Unspecified intestinal obstruction: Secondary | ICD-10-CM | POA: Diagnosis not present

## 2016-02-03 DIAGNOSIS — R1012 Left upper quadrant pain: Secondary | ICD-10-CM | POA: Diagnosis not present

## 2016-02-03 DIAGNOSIS — Z79899 Other long term (current) drug therapy: Secondary | ICD-10-CM | POA: Insufficient documentation

## 2016-02-03 DIAGNOSIS — R109 Unspecified abdominal pain: Secondary | ICD-10-CM

## 2016-02-03 DIAGNOSIS — Z7982 Long term (current) use of aspirin: Secondary | ICD-10-CM | POA: Insufficient documentation

## 2016-02-03 LAB — TROPONIN I: Troponin I: <0.03 ng/mL (ref <0.03)

## 2016-02-03 LAB — URINALYSIS COMPLETE WITH MICROSCOPIC (ARMC ONLY)
BACTERIA UA: NONE SEEN
BILIRUBIN URINE: NEGATIVE
Glucose, UA: NEGATIVE mg/dL
Ketones, ur: NEGATIVE mg/dL
Nitrite: NEGATIVE
PH: 7 (ref 5.0–8.0)
PROTEIN: NEGATIVE mg/dL
Specific Gravity, Urine: 1.006 (ref 1.005–1.030)

## 2016-02-03 LAB — CBC
HEMATOCRIT: 34.6 % — AB (ref 35.0–47.0)
Hemoglobin: 12.4 g/dL (ref 12.0–16.0)
MCH: 30.5 pg (ref 26.0–34.0)
MCHC: 35.8 g/dL (ref 32.0–36.0)
MCV: 85.2 fL (ref 80.0–100.0)
Platelets: 318 10*3/uL (ref 150–440)
RBC: 4.06 MIL/uL (ref 3.80–5.20)
RDW: 13 % (ref 11.5–14.5)
WBC: 7.6 10*3/uL (ref 3.6–11.0)

## 2016-02-03 LAB — LIPASE, BLOOD: LIPASE: 31 U/L (ref 11–51)

## 2016-02-03 LAB — COMPREHENSIVE METABOLIC PANEL
ALBUMIN: 3.1 g/dL — AB (ref 3.5–5.0)
ALT: 14 U/L (ref 14–54)
AST: 22 U/L (ref 15–41)
Alkaline Phosphatase: 79 U/L (ref 38–126)
Anion gap: 5 (ref 5–15)
BUN: 15 mg/dL (ref 6–20)
CHLORIDE: 104 mmol/L (ref 101–111)
CO2: 27 mmol/L (ref 22–32)
Calcium: 8.7 mg/dL — ABNORMAL LOW (ref 8.9–10.3)
Creatinine, Ser: 0.66 mg/dL (ref 0.44–1.00)
GFR calc Af Amer: >60 mL/min (ref >60)
GFR calc non Af Amer: >60 mL/min (ref >60)
GLUCOSE: 94 mg/dL (ref 65–99)
POTASSIUM: 4.4 mmol/L (ref 3.5–5.1)
SODIUM: 136 mmol/L (ref 135–145)
Total Bilirubin: 0.5 mg/dL (ref 0.3–1.2)
Total Protein: 6.2 g/dL — ABNORMAL LOW (ref 6.5–8.1)

## 2016-02-03 MED ORDER — METOPROLOL SUCCINATE ER 50 MG PO TB24
50.0000 mg | ORAL_TABLET | Freq: Every day | ORAL | Status: DC
  Administered 2016-02-03: 50 mg via ORAL
  Filled 2016-02-03: qty 1

## 2016-02-03 MED ORDER — IOPAMIDOL (ISOVUE-300) INJECTION 61%
30.0000 mL | Freq: Once | INTRAVENOUS | Status: AC | PRN
  Administered 2016-02-03: 30 mL via ORAL

## 2016-02-03 MED ORDER — LORAZEPAM 2 MG/ML IJ SOLN
INTRAMUSCULAR | Status: AC
  Filled 2016-02-03: qty 1

## 2016-02-03 MED ORDER — IOPAMIDOL (ISOVUE-300) INJECTION 61%
80.0000 mL | Freq: Once | INTRAVENOUS | Status: AC | PRN
  Administered 2016-02-03: 80 mL via INTRAVENOUS

## 2016-02-03 NOTE — ED Notes (Signed)
NAD noted at this time. Pt resting in bed with her husband at bedside. Denies any needs at this time. VSS at this time. Will continue to monitor for further patient needs.

## 2016-02-03 NOTE — ED Provider Notes (Signed)
-----------------------------------------   4:08 PM on 02/03/2016 -----------------------------------------   Blood pressure (!) 154/60, pulse 64, temperature 98.8 F (37.1 C), temperature source Oral, resp. rate 18, height 5\' 2"  (1.575 m), weight 50.8 kg, SpO2 98 %.  Assuming care from Dr. Shaune PollackLord.  In short, Destiny Torres is a 70 y.o. female with a chief complaint of Abdominal Pain .  Refer to the original H&P for additional details.  The current plan of care is to follow-up the CT scan results and reassess the patient for appropriateness of discharge versus admission.   Clinical Course  Value Comment By Time  CT Abdomen Pelvis W Contrast CT scan is reassuring.  His symptoms have the opportunity I will update the patient and anticipate discharge and outpatient follow-up as previously planned. Loleta Roseory Jarelle Ates, MD 09/02 1706   While the patient was awaiting the results of the CT scan she had a large volume loose bowel movement and states she feels much better with no residual abdominal pain.  I gave her the results of the CT scan and she is comfortable with plan to go home.  I will also give her the name and number of a gastroenterologist locally and interim with whom she may be able to follow-up.I gave my usual and customary return precautions.  Loleta Roseory Delvina Mizzell, MD 09/02 1711    Ct Abdomen Pelvis W Contrast  Result Date: 02/03/2016 CLINICAL DATA:  Upper left abdominal pain for 2 weeks. History of bowel obstruction. EXAM: CT ABDOMEN AND PELVIS WITH CONTRAST TECHNIQUE: Multidetector CT imaging of the abdomen and pelvis was performed using the standard protocol following bolus administration of intravenous contrast. CONTRAST:  80mL ISOVUE-300 IOPAMIDOL (ISOVUE-300) INJECTION 61% COMPARISON:  01/13/2016 FINDINGS: Lower chest: No pulmonary nodules, pleural effusions, or infiltrates. Large hiatal hernia is present. Heart size is normal. No imaged pericardial effusion or significant coronary artery  calcifications. Hepatobiliary: Liver is normal in appearance. Multiple gallstones are present, measuring on the order of 6 mm in diameter. Pancreas: Normal in appearance. Spleen: Small splenic cyst is identified, stable in appearance. Renal/Adrenal: The adrenal glands are normal in appearance. Kidneys are symmetric in size, enhancement, and excretion. No hydronephrosis or ureteral obstruction. Gastrointestinal tract: Large hiatal hernia is present. Otherwise the stomach is normal in appearance. Small bowel loops are normal in appearance. Numerous colonic diverticula are present. No associated inflammation or abscess. Reproductive/Pelvis: The uterus is present. No adnexal mass or free pelvic fluid. Vascular/Lymphatic: There is dense atherosclerotic calcification of the abdominal aorta. No aneurysm. No retroperitoneal or mesenteric adenopathy. Musculoskeletal/Abdominal wall: Significant spondylosis of the thoracolumbar spine. No suspicious lytic or blastic lesions are identified. Other: none IMPRESSION: 1.  No evidence for acute  abnormality. 2. Large hiatal hernia. 3. Cholelithiasis. 4. No bowel obstruction. 5. Colonic diverticulosis. 6.  Aortic atherosclerosis. 7. Thoraco lumbar spondylosis. Electronically Signed   By: Norva PavlovElizabeth  Brown M.D.   On: 02/03/2016 16:49       Loleta Roseory Jyron Turman, MD 02/03/16 1712

## 2016-02-03 NOTE — ED Notes (Signed)
NAD noted at this time. Pt placed back on the monitor. Pt reports 2 episodes of diarrhea. Will continue to monitor for further patient needs.

## 2016-02-03 NOTE — Discharge Instructions (Signed)

## 2016-02-03 NOTE — ED Provider Notes (Signed)
[facility] Emergency Department Provider Note ____________________________________________   I have reviewed the triage vital signs and the triage nursing note.  HISTORY  Chief Complaint Abdominal Pain   Historian Patient  HPI Destiny Torres is a 70 y.o. female here with left-sided upper and midabdomen abdominal painovernight which was waxing and waning and moderate in intensity. She did have a bowel movement this morning and symptoms seem a little bit better, she still has some discomfort when she presses in her left midabdomen. This is the same location where she recently was admitted with bowel obstruction about 2 weeks ago. No vomiting. No fevers. Was a fairly normal bowel movement this morning.  No chest pain.  Bowel obstruction was handled nonoperatively. She has no history of abdominal surgeries.    Past Medical History:  Diagnosis Date  . Arthritis   . GERD (gastroesophageal reflux disease)   . Hypertension     Patient Active Problem List   Diagnosis Date Noted  . Cholelithiasis 01/30/2016  . Epigastric pain   . SBO (small bowel obstruction) (HCC) 01/13/2016  . Tremor of both hands 10/03/2015  . Arthritis 01/02/2015  . Acid reflux 01/02/2015  . Benign hypertension 01/02/2015  . Dizziness 01/02/2015  . Essential (primary) hypertension 01/02/2015  . Gastro-esophageal reflux disease without esophagitis 01/02/2015  . 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 HAND SURGERY Right 04/2014  . TONSILLECTOMY      Prior to Admission medications   Medication Sig Start Date End Date Taking? Authorizing Provider  aspirin 81 MG EC tablet Take 1 tablet (81 mg total) by mouth daily. Swallow whole. 01/22/16   Emily Filbert, MD  cholecalciferol (VITAMIN D) 1000 units tablet Take 1,000 Units by mouth daily.    Historical Provider, MD  Magnesium 500 MG CAPS Take by mouth.    Historical Provider, MD   metoprolol succinate (TOPROL-XL) 50 MG 24 hr tablet Take 1 tablet (50 mg total) by mouth daily. Take with or immediately following a meal. 01/04/16   Jodell Cipro Chrismon, PA  Multiple Vitamin (MULTI-VITAMINS) TABS Take by mouth.    Historical Provider, MD  naproxen (NAPROSYN) 500 MG tablet Take 1 tablet (500 mg total) by mouth 2 (two) times daily with a meal. 12/19/15   Jodell Cipro Chrismon, PA  vitamin C (ASCORBIC ACID) 500 MG tablet Take by mouth.    Historical Provider, MD    Allergies  Allergen Reactions  . Penicillins     patient unsure of reaction  . Streptomycin Rash    Family History  Problem Relation Age of Onset  . Heart disease Mother   . Arthritis Mother   . Heart disease Father   . Stroke Father     Social History Social History  Substance Use Topics  . Smoking status: Never Smoker  . Smokeless tobacco: Never Used  . Alcohol use No    Review of Systems  Constitutional: Negative for fever. Eyes: Negative for visual changes. ENT: Negative for sore throat. Cardiovascular: Negative for chest pain. Respiratory: Negative for shortness of breath. Gastrointestinal: As per history of present illness Genitourinary: Negative for dysuria. No hematuria Musculoskeletal: Negative for back pain. Skin: Negative for rash. Neurological: Negative for headache. 10 point Review of Systems otherwise negative ____________________________________________   PHYSICAL EXAM:  VITAL SIGNS: ED Triage Vitals  Enc Vitals Group     BP 02/03/16 1032 130/60     Pulse Rate 02/03/16 1032 73  Resp 02/03/16 1032 18     Temp 02/03/16 1032 98.8 F (37.1 C)     Temp Source 02/03/16 1032 Oral     SpO2 02/03/16 1032 97 %     Weight 02/03/16 1033 112 lb (50.8 kg)     Height 02/03/16 1033 5\' 2"  (1.575 m)     Head Circumference --      Peak Flow --      Pain Score 02/03/16 1034 3     Pain Loc --      Pain Edu? --      Excl. in GC? --      Constitutional: Alert and oriented. Well  appearing and in no distress. HEENT   Head: Normocephalic and atraumatic.      Eyes: Conjunctivae are normal. PERRL. Normal extraocular movements.      Ears:         Nose: No congestion/rhinnorhea.   Mouth/Throat: Mucous membranes are moist.   Neck: No stridor. Cardiovascular/Chest: Normal rate, regular rhythm.  No murmurs, rubs, or gallops. Respiratory: Normal respiratory effort without tachypnea nor retractions. Breath sounds are clear and equal bilaterally. No wheezes/rales/rhonchi. Gastrointestinal: Soft. No distention, no guarding, no rebound. Mild tenderness left midabdomen.  Genitourinary/rectal:Deferred Musculoskeletal: Nontender with normal range of motion in all extremities. No joint effusions.  No lower extremity tenderness.  No edema. Neurologic:  Normal speech and language. No gross or focal neurologic deficits are appreciated. Skin:  Skin is warm, dry and intact. No rash noted. Psychiatric: Mood and affect are normal. Speech and behavior are normal. Patient exhibits appropriate insight and judgment.  ____________________________________________   EKG I, Governor Rooks, MD, the attending physician have personally viewed and interpreted all ECGs.  65 bpm. Normal sinus rhythm. Narrow respiratory, axis. Normal ST and T-wave ____________________________________________  LABS (pertinent positives/negatives)  Labs Reviewed  COMPREHENSIVE METABOLIC PANEL - Abnormal; Notable for the following:       Result Value   Calcium 8.7 (*)    Total Protein 6.2 (*)    Albumin 3.1 (*)    All other components within normal limits  CBC - Abnormal; Notable for the following:    HCT 34.6 (*)    All other components within normal limits  URINALYSIS COMPLETEWITH MICROSCOPIC ([facility] ONLY) - Abnormal; Notable for the following:    Color, Urine YELLOW (*)    APPearance CLEAR (*)    Hgb urine dipstick 2+ (*)    Leukocytes, UA TRACE (*)    Squamous Epithelial / LPF 0-5 (*)    All other  components within normal limits  LIPASE, BLOOD  TROPONIN I    ____________________________________________  RADIOLOGY All Xrays were viewed by me. Imaging interpreted by Radiologist.  Chest and abdomen x-rays:IMPRESSION: Mild chronic bronchitic changes without infiltrate.  Small hiatal hernia.  Single loop of small bowel in the LEFT mid abdomen shows mild bowel wall thickening, could represent edema or enteritis.  Bowel gas pattern otherwise normal.  Aortic atherosclerosis.   __________________________________________  PROCEDURES  Procedure(s) performed: None  Critical Care performed: None  ____________________________________________   ED COURSE / ASSESSMENT AND PLAN  Pertinent labs & imaging results that were available during my care of the patient were reviewed by me and considered in my medical decision making (see chart for details).   [name] is here with left-sided abdominal pain, and recent history of small bowel obstruction treated nonoperatively. She had a bowel movement this morning and so she thought that she probably didn't have a bowel obstruction,  however she spoke with the triage nurse to recommended she come to emergency department.  Here she is having some discomfort still, but less than it was overnight. Her labs are reassuring. Her x-ray showed possible bowel loop edema, and I discussed with her that I recommend CT imaging for further evaluation.  Patient states that she is improving, and I discussed with her that even if we saw partial obstruction, she might be okay to go home so long as she understood to come back for any worsening condition.  Patient care transferred to Dr. York CeriseForbach at shift change 4 PM. CT abdomen and pelvis is pending. I have discussed return precautions with the patient and her spouse, and if CT is reassuring and patient is agreeable, she may be discharged with my prepared discharge instructions.    CONSULTATIONS:    None   Patient / Family / Caregiver informed of clinical course, medical decision-making process, and agree with plan.   I discussed return precautions, follow-up instructions, and discharged instructions with patient and/or family.   ___________________________________________   FINAL CLINICAL IMPRESSION(S) / ED DIAGNOSES   Final diagnoses:  Left sided abdominal pain              Note: This dictation was prepared with Dragon dictation. Any transcriptional errors that result from this process are unintentional    Governor Rooksebecca Izayah Miner, MD 02/03/16 [phone]

## 2016-02-03 NOTE — ED Notes (Signed)
Pt returned from CT and assisted to the bathroom by this RN. Pt's husband at bedside at this time. NAD noted. Will continue to monitor for further patient needs.

## 2016-02-03 NOTE — ED Notes (Signed)
NAD noted at this time. Pt continues to rest in bed. Pt states she is drinking her 2nd bottle of contrast at this time. Will continue to monitor for further patient needs.

## 2016-02-03 NOTE — ED Notes (Signed)
NAD noted at time of D/C. Pt denies questions or concerns. Pt  Taken to the lobby via wheelchair by her husband at this time.

## 2016-02-03 NOTE — ED Notes (Signed)
Spoke with MD regarding pt's HR being 64 and giving Metoprolol, per MD okay to give due to medication being pt's home dose to be given at 1400.

## 2016-02-03 NOTE — ED Triage Notes (Signed)
Upper L abdominal pain x 2 weeks. States has had this pain x 2 weeks, states previously diagnosed with bowel obstruction, states pain decreased after having bowel movement this am. Denies vomiting.

## 2016-02-03 NOTE — ED Notes (Signed)
Pt resting in bed at this time. Pt given contrast to drink for CT. Denies further needs at this time, will continue to monitor for further patient needs.

## 2016-02-03 NOTE — ED Notes (Signed)
NAD noted at this time. Pt resting in bed with husband at bedside. Pt denies any needs, noted to be covered up with a blanket. Will continue to monitor for further patient needs.

## 2016-02-06 ENCOUNTER — Encounter: Payer: Self-pay | Admitting: *Deleted

## 2016-02-06 ENCOUNTER — Telehealth: Payer: Self-pay | Admitting: Family Medicine

## 2016-02-06 DIAGNOSIS — K8021 Calculus of gallbladder without cholecystitis with obstruction: Secondary | ICD-10-CM

## 2016-02-06 DIAGNOSIS — R109 Unspecified abdominal pain: Secondary | ICD-10-CM

## 2016-02-06 DIAGNOSIS — R002 Palpitations: Secondary | ICD-10-CM | POA: Diagnosis not present

## 2016-02-06 NOTE — Telephone Encounter (Signed)
Pt does not want appointment with surgeon.Appointment with Dr Lemar LivingsByrnett has been cancelled.She was seen in the ER again this past weekend and was told she will need to see Dr Servando SnareWohl for left sided abdominal pain.She was also advised to make an appointment with you but wants to know if you feel this is necessary.Please advise

## 2016-02-06 NOTE — Telephone Encounter (Signed)
With gallstones seen on CT scan but having pain on the opposite side of abdomen, should see gastroenterologist (Dr. Servando SnareWohl) if not seeing general surgeon. He should have some other options beside surgery.

## 2016-02-07 ENCOUNTER — Other Ambulatory Visit: Payer: Self-pay | Admitting: Family Medicine

## 2016-02-07 DIAGNOSIS — R109 Unspecified abdominal pain: Secondary | ICD-10-CM | POA: Insufficient documentation

## 2016-02-07 DIAGNOSIS — I10 Essential (primary) hypertension: Secondary | ICD-10-CM

## 2016-02-07 NOTE — Telephone Encounter (Signed)
Pt agrees to referral. Has been ordered. Allene DillonEmily Drozdowski, CMA

## 2016-02-14 DIAGNOSIS — I10 Essential (primary) hypertension: Secondary | ICD-10-CM | POA: Diagnosis not present

## 2016-02-14 DIAGNOSIS — R002 Palpitations: Secondary | ICD-10-CM | POA: Diagnosis not present

## 2016-02-14 DIAGNOSIS — R0602 Shortness of breath: Secondary | ICD-10-CM | POA: Diagnosis not present

## 2016-02-19 ENCOUNTER — Ambulatory Visit: Payer: Self-pay | Admitting: General Surgery

## 2016-02-26 ENCOUNTER — Encounter: Payer: Self-pay | Admitting: Gastroenterology

## 2016-02-26 ENCOUNTER — Ambulatory Visit (INDEPENDENT_AMBULATORY_CARE_PROVIDER_SITE_OTHER): Payer: Medicare HMO | Admitting: Gastroenterology

## 2016-02-26 VITALS — BP 168/70 | HR 60 | Temp 98.3°F | Ht 63.0 in | Wt 118.0 lb

## 2016-02-26 DIAGNOSIS — R112 Nausea with vomiting, unspecified: Secondary | ICD-10-CM | POA: Diagnosis not present

## 2016-02-26 DIAGNOSIS — G8929 Other chronic pain: Secondary | ICD-10-CM

## 2016-02-26 DIAGNOSIS — R1031 Right lower quadrant pain: Secondary | ICD-10-CM | POA: Diagnosis not present

## 2016-02-26 NOTE — Progress Notes (Signed)
Gastroenterology Consultation  Referring Provider:     Tamsen Roers, PA Primary Care Physician:  Dortha Kern, PA Primary Gastroenterologist:  Dr. Servando Snare     Reason for Consultation:     Abdominal pain        HPI:   Destiny Torres is a 70 y.o. y/o female referred for consultation & management of Abdominal pain by Dr. Dortha Kern, PA.  This patient comes in today with a history of abdominal pain. The patient also reports that she has chronic nausea. The patient does take Pepcid before she goes to sleep and reports that she takes anywhere from a quarter of a pill to a half a pill to stop her nocturnal heartburn. The patient also reports that most of her nausea started after she had surgery on her hands for arthritis. There is no report of any black stools or bloody stools. She also denies any vomiting. The patient has been to the ER a few times for feeling nauseous with dizziness. The patient states she is against taking medications for anything and wants to do everything naturally. The patient has lost approximately 10 pounds over the last year. There is no report of any foods to make the abdominal pain worse. The patient did have a CT scan was told that she had gallstones. He was also noted to have a large hiatal hernia.  Past Medical History:  Diagnosis Date  . Arthritis   . GERD (gastroesophageal reflux disease)   . Hypertension     Past Surgical History:  Procedure Laterality Date  . HAND SURGERY Right 04/2014  . TONSILLECTOMY      Prior to Admission medications   Medication Sig Start Date End Date Taking? Authorizing Provider  aspirin 81 MG EC tablet Take 1 tablet (81 mg total) by mouth daily. Swallow whole. 01/22/16  Yes Emily Filbert, MD  cholecalciferol (VITAMIN D) 1000 units tablet Take 1,000 Units by mouth daily.   Yes Historical Provider, MD  Magnesium 500 MG CAPS Take by mouth.   Yes Historical Provider, MD  metoprolol succinate (TOPROL-XL) 50 MG 24 hr  tablet TAKE ONE TABLET BY MOUTH ONCE DAILY TAKE  WITH  OR  IMMEDIATELY  FOLLOWING  A  MEAL 02/09/16  Yes [name] E Chrismon, PA  vitamin B-12 (CYANOCOBALAMIN) 1000 MCG tablet Take 1,000 mcg by mouth daily.   Yes Historical Provider, MD  vitamin C (ASCORBIC ACID) 500 MG tablet Take by mouth.   Yes Historical Provider, MD  Multiple Vitamin (MULTI-VITAMINS) TABS Take by mouth.    Historical Provider, MD    Family History  Problem Relation Age of Onset  . Heart disease Mother   . Arthritis Mother   . Heart disease Father   . Stroke Father      Social History  Substance Use Topics  . Smoking status: Never Smoker  . Smokeless tobacco: Never Used  . Alcohol use No    Allergies as of 02/26/2016 - Review Complete 02/26/2016  Allergen Reaction Noted  . Penicillins  01/02/2015  . Streptomycin Rash 01/03/2015    Review of Systems:    All systems reviewed and negative except where noted in HPI.   Physical Exam:  BP (!) 168/70   Pulse 60   Temp 98.3 F (36.8 C) (Oral)   Ht 5\' 3"  (1.6 m)   Wt 118 lb (53.5 kg)   BMI 20.90 kg/m  No LMP recorded. Patient is postmenopausal. Psych:  Alert and cooperative. Normal mood and  affect. General:   Alert,  Well-developed, well-nourished, pleasant and cooperative in NAD Head:  Normocephalic and atraumatic. Eyes:  Sclera clear, no icterus.   Conjunctiva pink. Ears:  Normal auditory acuity. Nose:  No deformity, discharge, or lesions. Mouth:  No deformity or lesions,oropharynx pink & moist. Neck:  Supple; no masses or thyromegaly. Lungs:  Respirations even and unlabored.  Clear throughout to auscultation.   No wheezes, crackles, or rhonchi. No acute distress. Heart:  Regular rate and rhythm; no murmurs, clicks, rubs, or gallops. Abdomen:  Normal bowel sounds.  No bruits.  Soft, non-tender and positive tenderness to 1 finger palpation while flexing the abdominal wall without masses, hepatosplenomegaly or hernias noted.  No guarding or rebound  tenderness.  Negative Carnett sign.   Rectal:  Deferred.  Msk:  Symmetrical with gross deformities from arthritis.  Good, equal movement & strength bilaterally. Pulses:  Normal pulses noted. Extremities:  No clubbing or edema.  No cyanosis. Positive for arthritic changes of her hands Neurologic:  Alert and oriented x3;  grossly normal neurologically. Skin:  Intact without significant lesions or rashes.  No jaundice. Lymph Nodes:  No significant cervical adenopathy. Psych:  Alert and cooperative. Normal mood and affect.  Imaging Studies: Ct Abdomen Pelvis W Contrast  Result Date: 02/03/2016 CLINICAL DATA:  Upper left abdominal pain for 2 weeks. History of bowel obstruction. EXAM: CT ABDOMEN AND PELVIS WITH CONTRAST TECHNIQUE: Multidetector CT imaging of the abdomen and pelvis was performed using the standard protocol following bolus administration of intravenous contrast. CONTRAST:  80mL ISOVUE-300 IOPAMIDOL (ISOVUE-300) INJECTION 61% COMPARISON:  01/13/2016 FINDINGS: Lower chest: No pulmonary nodules, pleural effusions, or infiltrates. Large hiatal hernia is present. Heart size is normal. No imaged pericardial effusion or significant coronary artery calcifications. Hepatobiliary: Liver is normal in appearance. Multiple gallstones are present, measuring on the order of 6 mm in diameter. Pancreas: Normal in appearance. Spleen: Small splenic cyst is identified, stable in appearance. Renal/Adrenal: The adrenal glands are normal in appearance. Kidneys are symmetric in size, enhancement, and excretion. No hydronephrosis or ureteral obstruction. Gastrointestinal tract: Large hiatal hernia is present. Otherwise the stomach is normal in appearance. Small bowel loops are normal in appearance. Numerous colonic diverticula are present. No associated inflammation or abscess. Reproductive/Pelvis: The uterus is present. No adnexal mass or free pelvic fluid. Vascular/Lymphatic: There is dense atherosclerotic  calcification of the abdominal aorta. No aneurysm. No retroperitoneal or mesenteric adenopathy. Musculoskeletal/Abdominal wall: Significant spondylosis of the thoracolumbar spine. No suspicious lytic or blastic lesions are identified. Other: none IMPRESSION: 1.  No evidence for acute  abnormality. 2. Large hiatal hernia. 3. Cholelithiasis. 4. No bowel obstruction. 5. Colonic diverticulosis. 6.  Aortic atherosclerosis. 7. Thoraco lumbar spondylosis. Electronically Signed   By: Norva Pavlov M.D.   On: 02/03/2016 16:49   Dg Abdomen Acute W/chest  Result Date: 02/03/2016 CLINICAL DATA:  Recent small bowel obstruction, and complaining of abdominal pain now, history hypertension, GERD EXAM: DG ABDOMEN ACUTE W/ 1V CHEST COMPARISON:  Chest radiograph 01/22/2016, abdominal radiographs 01/16/2016 FINDINGS: Minimal enlargement of cardiac silhouette. Calcified tortuous thoracic aorta. Small hiatal hernia. Mediastinal contours and pulmonary vascularity otherwise normal. Minimal chronic bronchitic changes. No acute infiltrate, pleural effusion or pneumothorax. Small bibasilar effusions and bibasilar atelectasis on previous exam resolved. Nonobstructive bowel gas pattern. Scattered gas within large and small bowel loops. Stool RIGHT colon. Single loop of bowel in the LEFT mid abdomen demonstrates mild wall thickening, question small bowel. Osseous demineralization with degenerative disc and facet disease changes of  the lumbar spine associated with dextroconvex scoliosis. No definite urinary tract calcification. IMPRESSION: Mild chronic bronchitic changes without infiltrate. Small hiatal hernia. Single loop of small bowel in the LEFT mid abdomen shows mild bowel wall thickening, could represent edema or enteritis. Bowel gas pattern otherwise normal. Aortic atherosclerosis. Electronically Signed   By: Ulyses SouthwardMark  Boles M.D.   On: 02/03/2016 12:11    Assessment and Plan:   Destiny Torres is a 70 y.o. y/o female comes in today  with chronic nausea and abdominal pain. The patient's abdominal pain appears to be muscular skeletal and reproducible with 1 finger palpation while flexing the abdominal wall muscles. The patient also has a history of chronic nausea since having surgery on her hands. The patient also reports a 10 pound weight loss. The patient was sent home with samples of Dexilant 60 mg to be taken once a day to see if this helps her nausea. She has been told that if her nausea does not improve she may need to be set up for an upper endoscopy. If her symptoms do improve then she will be continued on the Dexilant 60 mg once a day. The patient has been explained the plan and agrees with it   Note: This dictation was prepared with Dragon dictation along with smaller phrase technology. Any transcriptional errors that result from this process are unintentional.

## 2016-03-07 ENCOUNTER — Ambulatory Visit: Payer: Medicare HMO | Admitting: Gastroenterology

## 2016-03-14 ENCOUNTER — Telehealth: Payer: Self-pay | Admitting: Gastroenterology

## 2016-03-14 NOTE — Telephone Encounter (Signed)
Patient left a voice message that the samples of Dexilant was working fine and she will need a RX for it called into [facility] on [address]

## 2016-03-15 ENCOUNTER — Other Ambulatory Visit: Payer: Self-pay

## 2016-03-15 DIAGNOSIS — K2100 Gastro-esophageal reflux disease with esophagitis, without bleeding: Secondary | ICD-10-CM

## 2016-03-15 MED ORDER — DEXLANSOPRAZOLE 60 MG PO CPDR: 60.0000 mg | DELAYED_RELEASE_CAPSULE | Freq: Every day | ORAL | 11 refills | Status: DC

## 2016-03-15 NOTE — Telephone Encounter (Signed)
Left vm letting pt know rx has been sent to pharmacy.

## 2016-03-18 ENCOUNTER — Other Ambulatory Visit: Payer: Self-pay

## 2016-03-18 DIAGNOSIS — K2100 Gastro-esophageal reflux disease with esophagitis, without bleeding: Secondary | ICD-10-CM

## 2016-03-18 MED ORDER — PANTOPRAZOLE SODIUM 40 MG PO TBEC: 40.0000 mg | DELAYED_RELEASE_TABLET | Freq: Every day | ORAL | 3 refills | Status: DC

## 2016-04-09 ENCOUNTER — Ambulatory Visit (INDEPENDENT_AMBULATORY_CARE_PROVIDER_SITE_OTHER): Payer: Medicare HMO | Admitting: Family Medicine

## 2016-04-09 ENCOUNTER — Encounter: Payer: Self-pay | Admitting: Family Medicine

## 2016-04-09 VITALS — BP 132/84 | HR 62 | Temp 98.2°F | Wt 118.2 lb

## 2016-04-09 DIAGNOSIS — R251 Tremor, unspecified: Secondary | ICD-10-CM

## 2016-04-09 DIAGNOSIS — R2689 Other abnormalities of gait and mobility: Secondary | ICD-10-CM | POA: Diagnosis not present

## 2016-04-09 DIAGNOSIS — M1990 Unspecified osteoarthritis, unspecified site: Secondary | ICD-10-CM

## 2016-04-09 DIAGNOSIS — I10 Essential (primary) hypertension: Secondary | ICD-10-CM | POA: Diagnosis not present

## 2016-04-09 NOTE — Progress Notes (Signed)
Patient: Destiny MoutonGlenda Torres Female    DOB: 02/20/1946   70 y.o.   MRN: [id number] Visit Date: 04/09/2016  Today's Provider: Dortha Kernennis Chrismon, PA   Chief Complaint  Patient presents with  . Tremors  . Follow-up   Subjective:    HPI   Follow up for tremors  The patient was last seen for this 6 month ago. Changes made at last visit include labs checked, continue increased dose of Metoprolol, patient preferred to postpone neurology referral.   She reports excellent compliance with treatment. She feels that condition is worse. Patient reports having more tremors and feeling dizzy near syncope. Patient is concerned Metoprolol dose is too high and resulting in increased tremors.   Past Medical History:  Diagnosis Date  . Arthritis   . GERD (gastroesophageal reflux disease)   . Hypertension    Past Surgical History:  Procedure Laterality Date  . HAND SURGERY Right 04/2014  . TONSILLECTOMY     Family History  Problem Relation Age of Onset  . Heart disease Mother   . Arthritis Mother   . Heart disease Father   . Stroke Father     Previous Medications   ASPIRIN 81 MG EC TABLET    Take 1 tablet (81 mg total) by mouth daily. Swallow whole.   CHOLECALCIFEROL (VITAMIN D) 1000 UNITS TABLET    Take 1,000 Units by mouth daily.   MAGNESIUM 500 MG CAPS    Take by mouth.   METOPROLOL SUCCINATE (TOPROL-XL) 50 MG 24 HR TABLET    TAKE ONE TABLET BY MOUTH ONCE DAILY TAKE  WITH  OR  IMMEDIATELY  FOLLOWING  A  MEAL   MULTIPLE VITAMIN (MULTI-VITAMINS) TABS    Take by mouth.   PANTOPRAZOLE (PROTONIX) 40 MG TABLET    Take 1 tablet (40 mg total) by mouth daily.   VITAMIN B-12 (CYANOCOBALAMIN) 1000 MCG TABLET    Take 1,000 mcg by mouth daily.   VITAMIN C (ASCORBIC ACID) 500 MG TABLET    Take by mouth.    Review of Systems  Constitutional: Negative.   Respiratory: Negative.   Cardiovascular: Negative.   Neurological: Positive for dizziness and tremors.    Social History  Substance Use Topics   . Smoking status: Never Smoker  . Smokeless tobacco: Never Used  . Alcohol use No   Objective:   BP 132/84 (BP Location: Right Arm, Patient Position: Sitting, Cuff Size: Normal)   Pulse 62   Temp 98.2 F (36.8 C) (Oral)   Wt 118 lb 3.2 oz (53.6 kg)   BMI 20.94 kg/m   Physical Exam  Constitutional: She appears well-developed and well-nourished.  HENT:  Head: Normocephalic and atraumatic.  Right Ear: External ear normal.  Left Ear: External ear normal.  Nose: Nose normal.  Mouth/Throat: Oropharynx is clear and moist.  Eyes: Conjunctivae are normal.  Neck: Neck supple. No JVD present. No thyromegaly present.  Cardiovascular: Normal rate and regular rhythm.   Pulmonary/Chest: Effort normal and breath sounds normal.  Abdominal: Bowel sounds are normal.  Musculoskeletal:  Dorsal thoracic kyphosis prominent. Enlarged MCP joints of right 3rd and 4th fingers with history of extensor tendon surgery. Also, enlarged left 3rd MCP joint with extensor tendon sliding laterally with flexion.  Lymphadenopathy:    She has no cervical adenopathy.      Assessment & Plan:     1. 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 States other family members have had tremors and  one niece with ALS. Feels the tremor, fatigue and poor balance/dizziness is beginning to worsen. Recommend follow up labs and schedule neurology referral. - CBC with Differential/Platelet - Comprehensive metabolic panel - TSH - Ambulatory referral to Neurology  2. Poor balance Continues to have some balance difficulty versus dizziness. Will check labs for electrolyte imbalance, anemia, etc.and schedule neurology referral. No history of stroke or injury. - CBC with Differential/Platelet - Comprehensive metabolic panel - Ambulatory referral to Neurology  3. Arthritis Stiffness and back discomfort from arthritis. Also, history of enlarged MCP joints with tendon surgery and  residual poor grip with difficulty using hands. Essentially unchanged. - CBC with Differential/Platelet  4. Benign hypertension Good control on the Metoprolol succinate 50 mg qd. Will recheck labs and follow up pending reports. - CBC with Differential/Platelet - Comprehensive metabolic panel - Lipid panel - TSH

## 2016-04-10 DIAGNOSIS — M1990 Unspecified osteoarthritis, unspecified site: Secondary | ICD-10-CM | POA: Diagnosis not present

## 2016-04-10 DIAGNOSIS — R251 Tremor, unspecified: Secondary | ICD-10-CM | POA: Diagnosis not present

## 2016-04-10 DIAGNOSIS — I10 Essential (primary) hypertension: Secondary | ICD-10-CM | POA: Diagnosis not present

## 2016-04-10 DIAGNOSIS — R2689 Other abnormalities of gait and mobility: Secondary | ICD-10-CM | POA: Diagnosis not present

## 2016-04-11 ENCOUNTER — Telehealth: Payer: Self-pay

## 2016-04-11 LAB — CBC WITH DIFFERENTIAL/PLATELET
BASOS ABS: 0 10*3/uL (ref 0.0–0.2)
Basos: 0 %
EOS (ABSOLUTE): 0.3 10*3/uL (ref 0.0–0.4)
EOS: 4 %
HEMATOCRIT: 36.6 % (ref 34.0–46.6)
HEMOGLOBIN: 12.5 g/dL (ref 11.1–15.9)
IMMATURE GRANULOCYTES: 1 %
Immature Grans (Abs): 0.1 10*3/uL (ref 0.0–0.1)
LYMPHS ABS: 3.3 10*3/uL — AB (ref 0.7–3.1)
Lymphs: 40 %
MCH: 28.3 pg (ref 26.6–33.0)
MCHC: 34.2 g/dL (ref 31.5–35.7)
MCV: 83 fL (ref 79–97)
MONOCYTES: 9 %
Monocytes Absolute: 0.7 10*3/uL (ref 0.1–0.9)
NEUTROS PCT: 46 %
Neutrophils Absolute: 4 10*3/uL (ref 1.4–7.0)
Platelets: 295 10*3/uL (ref 150–379)
RBC: 4.41 x10E6/uL (ref 3.77–5.28)
RDW: 12.8 % (ref 12.3–15.4)
WBC: 8.4 10*3/uL (ref 3.4–10.8)

## 2016-04-11 LAB — LIPID PANEL
CHOL/HDL RATIO: 3.1 ratio (ref 0.0–4.4)
CHOLESTEROL TOTAL: 200 mg/dL — AB (ref 100–199)
HDL: 65 mg/dL (ref >39)
LDL CALC: 116 mg/dL — AB (ref 0–99)
TRIGLYCERIDES: 97 mg/dL (ref 0–149)
VLDL CHOLESTEROL CAL: 19 mg/dL (ref 5–40)

## 2016-04-11 LAB — COMPREHENSIVE METABOLIC PANEL
ALBUMIN: 3.9 g/dL (ref 3.5–4.8)
ALK PHOS: 110 IU/L (ref 39–117)
ALT: 11 IU/L (ref 0–32)
AST: 21 IU/L (ref 0–40)
Albumin/Globulin Ratio: 1.5 (ref 1.2–2.2)
BUN/Creatinine Ratio: 14 (ref 12–28)
BUN: 12 mg/dL (ref 8–27)
Bilirubin Total: 0.5 mg/dL (ref 0.0–1.2)
CALCIUM: 9.3 mg/dL (ref 8.7–10.3)
CO2: 26 mmol/L (ref 18–29)
CREATININE: 0.87 mg/dL (ref 0.57–1.00)
Chloride: 100 mmol/L (ref 96–106)
GFR calc Af Amer: 77 mL/min/{1.73_m2} (ref >59)
GFR, EST NON AFRICAN AMERICAN: 67 mL/min/{1.73_m2} (ref >59)
GLOBULIN, TOTAL: 2.6 g/dL (ref 1.5–4.5)
GLUCOSE: 90 mg/dL (ref 65–99)
Potassium: 5 mmol/L (ref 3.5–5.2)
Sodium: 139 mmol/L (ref 134–144)
Total Protein: 6.5 g/dL (ref 6.0–8.5)

## 2016-04-11 LAB — TSH: TSH: 2.87 u[IU]/mL (ref 0.450–4.500)

## 2016-04-11 NOTE — Telephone Encounter (Signed)
Patient advised.

## 2016-04-11 NOTE — Telephone Encounter (Signed)
LMTCB

## 2016-04-11 NOTE — Telephone Encounter (Signed)
-----   Message from Tamsen Roersennis E Chrismon, [place] sent at 04/11/2016  9:47 AM EST ----- Lab tests essentially normal. Proceed with neurology evaluation of dizziness and tremor.

## 2016-04-16 ENCOUNTER — Ambulatory Visit: Payer: Medicare HMO | Admitting: Family Medicine

## 2016-04-22 DIAGNOSIS — R259 Unspecified abnormal involuntary movements: Secondary | ICD-10-CM | POA: Diagnosis not present

## 2016-04-22 DIAGNOSIS — R2681 Unsteadiness on feet: Secondary | ICD-10-CM | POA: Diagnosis not present

## 2016-05-21 ENCOUNTER — Telehealth: Payer: Self-pay

## 2016-05-21 NOTE — Telephone Encounter (Signed)
Patient advised. Patient scheduled for a follow up appointment on 06/04/2016.

## 2016-05-21 NOTE — Telephone Encounter (Signed)
May taper back to 1/2 tablet of the Metoprolol. Should check BP in a couple weeks and continue follow up with Dr. Sherryll BurgerShah.

## 2016-05-21 NOTE — Telephone Encounter (Signed)
Patient is requesting to have metoprolol succinate 50 mg to half. Pt reports that she has been taking the 50 mg but has been having some weakness and shakiness after taking the medication. Patient reports she has not been able to check BP at home. Patient reports she has seen Dr. Sherryll BurgerShah neurologist for her parkinson's disease and has been taking carbidopa-levodopa. Patient reports that dr. Sherryll BurgerShah mentioned that she may stop metoprolol in the future. Please advise. CB# [phone] or [phone]

## 2016-05-29 ENCOUNTER — Telehealth: Payer: Self-pay

## 2016-05-29 NOTE — Telephone Encounter (Signed)
Patient advised as below. Patient reports that she is now taking carbidopa-levodopa 25-100 mg started on 05/22/16. Patient wants to make sure the she is able to take the OTC medications with this new medication. Please review. sd

## 2016-05-29 NOTE — Telephone Encounter (Signed)
Please let patient know. im on the phones :)-aa

## 2016-05-29 NOTE — Telephone Encounter (Signed)
Yes I know. I reviewed and checked interactions online. There were no known interactions with either of these medications.

## 2016-05-29 NOTE — Telephone Encounter (Signed)
I would recommend patient using Coricidin HBP or Mucinex DM for cough and congestion. May use tylenol for fever and aches. Most likely viral. Stay well hydrated and get plenty of rest. Viral infections normally last for 7-10 days. She has appt scheduled on 06/04/16 with Maurine Ministerennis for BP f/u. She can be evaluated then if symptoms persist.   She does need to call the office if symptoms worsen or if she develops a high fever.

## 2016-05-29 NOTE — Telephone Encounter (Signed)
Patient advised as below. Patient verbalizes understanding and is in agreement with treatment plan.  

## 2016-05-29 NOTE — Telephone Encounter (Signed)
Patient called asking if Destiny Torres would call her some antibiotic in but I told her he is not here this week. She wanted to check if someone would call her something in. [place] in [place] and do not have any walk ins or anything where they are and her husband sick too so can not come in. Symptoms started Saturday 05/25/16. Current symptoms are sore throat, post nasal drainage, head congestion, coughing-slight productivity, chest congestion, some body aches, some chills and sweats and felt like she had fever but did not check it. Has not tried anything OTC due to starting a new Parkinsons medication a few weeks back and was afraid of contradictions with it. I advised patient we do not usually treat over the phone but she wanted to see if I could ask. Please review-aa

## 2016-06-04 ENCOUNTER — Ambulatory Visit (INDEPENDENT_AMBULATORY_CARE_PROVIDER_SITE_OTHER): Payer: Medicare HMO | Admitting: Family Medicine

## 2016-06-04 ENCOUNTER — Encounter: Payer: Self-pay | Admitting: Family Medicine

## 2016-06-04 VITALS — BP 140/78 | HR 74 | Temp 98.4°F | Resp 16 | Wt 120.0 lb

## 2016-06-04 DIAGNOSIS — I10 Essential (primary) hypertension: Secondary | ICD-10-CM | POA: Diagnosis not present

## 2016-06-04 DIAGNOSIS — G20 Parkinson's disease: Secondary | ICD-10-CM | POA: Diagnosis not present

## 2016-06-04 DIAGNOSIS — J209 Acute bronchitis, unspecified: Secondary | ICD-10-CM | POA: Diagnosis not present

## 2016-06-04 DIAGNOSIS — G20A1 Parkinson's disease without dyskinesia, without mention of fluctuations: Secondary | ICD-10-CM

## 2016-06-04 NOTE — Progress Notes (Signed)
Patient: Destiny MoutonGlenda Torres Female    DOB: 02/03/1946   71 y.o.   MRN: [id number] Visit Date: 06/04/2016  Today's Provider: Dortha Kernennis Gabriell Daigneault, PA   Chief Complaint  Patient presents with  . Hypertension  . URI   Subjective:    URI   This is a new problem. The current episode started 1 to 4 weeks ago (x 9 days). The problem has been unchanged. Associated symptoms include chest pain, congestion, coughing (dry), ear pain and rhinorrhea. Associated symptoms comments: Pt also c/o chills, sweats, chest tightness and a choking sensation. Treatments tried: Coricidin, which caused pt to not feel well so she D/C it.        Hypertension, follow-up:  BP Readings from Last 3 Encounters:  06/04/16 140/78  04/09/16 132/84  02/26/16 (!) 168/70    She was last seen for hypertension 2 months ago.  BP at that visit was 132/84. Management since that visit includes checking labs and continuing Metoprolol. Labs were normal. Pt called office on 05/21/2016. Pt was feeling weak and shaky. Pt was advised to decrease Metoprolol to 1/2 tablet po qd. She reports good compliance with treatment. She is having side effects. Pt still feeling nervous, shaky and presyncopal after taking medication. Outside blood pressures are not being checked. She is experiencing near-syncope.    Weight trend: stable Wt Readings from Last 3 Encounters:  06/04/16 120 lb (54.4 kg)  04/09/16 118 lb 3.2 oz (53.6 kg)  02/26/16 118 lb (53.5 kg)    ------------------------------------------------------------------------ Past Medical History:  Diagnosis Date  . Arthritis   . GERD (gastroesophageal reflux disease)   . Hypertension    Patient Active Problem List   Diagnosis Date Noted  . Abdominal pain 02/07/2016  . Cholelithiasis 01/30/2016  . Palpitations 01/24/2016  . Shortness of breath 01/24/2016  . Epigastric pain   . SBO (small bowel obstruction) 01/13/2016  . Tremor of both hands 10/03/2015  . Arthritis  01/02/2015  . Acid reflux 01/02/2015  . Benign hypertension 01/02/2015  . Dizziness 01/02/2015  . Essential (primary) hypertension 01/02/2015  . Gastro-esophageal reflux disease without esophagitis 01/02/2015  . Lax, ligament 11/09/2014   Past Surgical History:  Procedure Laterality Date  . HAND SURGERY Right 04/2014  . TONSILLECTOMY     Family History  Problem Relation Age of Onset  . Heart disease Mother   . Arthritis Mother   . Heart disease Father   . Stroke Father     Allergies  Allergen Reactions  . Penicillins     patient unsure of reaction  . Streptomycin Rash     Current Outpatient Prescriptions:  .  aspirin 81 MG EC tablet, Take 1 tablet (81 mg total) by mouth daily. Swallow whole., Disp: 30 tablet, Rfl: 12 .  carbidopa-levodopa (SINEMET IR) 25-100 MG tablet, Take by mouth., Disp: , Rfl:  .  cholecalciferol (VITAMIN D) 1000 units tablet, Take 1,000 Units by mouth daily., Disp: , Rfl:  .  famotidine (PEPCID) 40 MG tablet, Take 40 mg by mouth daily., Disp: , Rfl:  .  Magnesium 500 MG CAPS, Take by mouth., Disp: , Rfl:  .  metoprolol succinate (TOPROL-XL) 50 MG 24 hr tablet, TAKE ONE TABLET BY MOUTH ONCE DAILY TAKE  WITH  OR  IMMEDIATELY  FOLLOWING  A  MEAL (Patient taking differently: TAKE 1/2 TABLET BY MOUTH ONCE DAILY TAKE  WITH  OR  IMMEDIATELY  FOLLOWING  A  MEAL), Disp: 90 tablet, Rfl: 1 .  vitamin B-12 (CYANOCOBALAMIN) 1000 MCG tablet, Take 1,000 mcg by mouth daily., Disp: , Rfl:  .  vitamin C (ASCORBIC ACID) 500 MG tablet, Take by mouth., Disp: , Rfl:   Review of Systems  Constitutional: Positive for chills, diaphoresis and fatigue. Negative for activity change, appetite change, fever and unexpected weight change.  HENT: Positive for congestion, ear pain and rhinorrhea.   Respiratory: Positive for cough (dry), chest tightness and shortness of breath.   Cardiovascular: Positive for chest pain. Negative for palpitations and leg swelling.    Social History    Substance Use Topics  . Smoking status: Never Smoker  . Smokeless tobacco: Never Used  . Alcohol use No   Objective:   BP 140/78 (BP Location: Right Arm, Patient Position: Sitting, Cuff Size: Normal)   Pulse 74   Temp 98.4 F (36.9 C) (Oral)   Resp 16   Wt 120 lb (54.4 kg)   SpO2 97%   BMI 21.26 kg/m   Physical Exam  Constitutional: She appears well-developed and well-nourished.  HENT:  Head: Normocephalic.  Right Ear: External ear normal.  Left Ear: External ear normal.  Slightly reddened posterior pharynx with slight cobblestoning. Fluid bubbles behind right TM. No redness or drainage.   Eyes: Conjunctivae are normal.  Neck: Neck supple.  Cardiovascular: Normal rate and regular rhythm.   Pulmonary/Chest: Effort normal.  Coarse breath sounds with some rhonchi that clears with cough.  Abdominal: Soft. Bowel sounds are normal.  Lymphadenopathy:    She has no cervical adenopathy.  Neurological: She is alert.      Assessment & Plan:     1. Acute bronchitis, unspecified organism Onset of cough and congestion over th past week or 10 days. Denies fever but continues to have fatigue, cough and sputum production. Will give Z-pak (written) and may add Mucinex-DM or Robitussin-DM. Increase fluid intake and recheck prn.  2. Benign hypertension Stable and tolerating the Metoprolol-SL 50 mg 1/2 tablet daily with less weakness. Increase fluid intake and continue same dosage.  3. Parkinson's disease (HCC) Diagnosed by Dr. Sherryll Burger and treated with Sinemet 25-100 mg TID. Follow up appointment with neurologist in 2 weeks. States she is still having the tremor intermittently with poor balance.     Patient seen and examined by Dortha Kern, PA, and note scribed by Allene Dillon, CMA.  Dortha Kern, PA  [facility]

## 2016-06-18 DIAGNOSIS — R2681 Unsteadiness on feet: Secondary | ICD-10-CM | POA: Diagnosis not present

## 2016-06-18 DIAGNOSIS — G20 Parkinson's disease: Secondary | ICD-10-CM | POA: Diagnosis not present

## 2016-06-18 DIAGNOSIS — R4189 Other symptoms and signs involving cognitive functions and awareness: Secondary | ICD-10-CM | POA: Diagnosis not present

## 2016-06-19 DIAGNOSIS — R2681 Unsteadiness on feet: Secondary | ICD-10-CM | POA: Insufficient documentation

## 2016-08-19 ENCOUNTER — Telehealth: Payer: Self-pay | Admitting: Family Medicine

## 2016-08-19 NOTE — Telephone Encounter (Signed)
Pt called saying her blood pressure has been running a little high 159/78.  She wants to know if she needs to increase her blood pressure meds  Please advise  [phone].  Thanks  Fortune Brandsteri

## 2016-08-19 NOTE — Telephone Encounter (Signed)
I called and advised her to go back to a whole tablet of the Metoprolol Succinate 50 mg qd. Schedule follow up in 2-3 months. Patient understands and agrees with plan.

## 2016-09-03 ENCOUNTER — Encounter: Payer: Self-pay | Admitting: Family Medicine

## 2016-09-03 ENCOUNTER — Ambulatory Visit (INDEPENDENT_AMBULATORY_CARE_PROVIDER_SITE_OTHER): Payer: Medicare HMO

## 2016-09-03 ENCOUNTER — Ambulatory Visit (INDEPENDENT_AMBULATORY_CARE_PROVIDER_SITE_OTHER): Payer: Medicare HMO | Admitting: Family Medicine

## 2016-09-03 VITALS — BP 152/76 | HR 60 | Temp 99.2°F | Wt 121.2 lb

## 2016-09-03 VITALS — BP 158/82 | HR 60 | Temp 99.2°F | Ht 63.0 in | Wt 121.2 lb

## 2016-09-03 DIAGNOSIS — I10 Essential (primary) hypertension: Secondary | ICD-10-CM | POA: Diagnosis not present

## 2016-09-03 DIAGNOSIS — G20 Parkinson's disease: Secondary | ICD-10-CM

## 2016-09-03 DIAGNOSIS — Z0000 Encounter for general adult medical examination without abnormal findings: Secondary | ICD-10-CM

## 2016-09-03 DIAGNOSIS — Z13820 Encounter for screening for osteoporosis: Secondary | ICD-10-CM

## 2016-09-03 DIAGNOSIS — M1990 Unspecified osteoarthritis, unspecified site: Secondary | ICD-10-CM | POA: Diagnosis not present

## 2016-09-03 DIAGNOSIS — Z1159 Encounter for screening for other viral diseases: Secondary | ICD-10-CM | POA: Diagnosis not present

## 2016-09-03 NOTE — Progress Notes (Signed)
Patient: Destiny Torres Female    DOB: 1945-08-14   71 y.o.   MRN: [id number] Visit Date: 09/03/2016  Today's Provider: Dortha Kern, PA   Chief Complaint  Patient presents with  . Hypertension  . Follow-up   Subjective:    HPI  Patient is here to follow up from [facility] done today. Patient would like to discuss medication for Parkinson's.    Hypertension, follow-up:  BP Readings from Last 3 Encounters:  09/03/16 (!) 158/82  09/03/16 (!) 158/82  06/04/16 140/78    She was last seen for hypertension 3 months ago.  BP at that visit was 140/78. Management changes since that visit include patient was advised to increase fluid intake and continue Metoprolol-SL 50 mg. She reports good compliance with treatment. She is not having side effects.  She is not exercising. She is adherent to low salt diet.   Outside blood pressures are being checked. She is experiencing none.  Patient denies chest pain, chest pressure/discomfort, irregular heart beat and palpitations.   Cardiovascular risk factors include advanced age (older than 59 for men, 24 for women) and hypertension.  Use of agents associated with hypertension: none.     Weight trend: stable Wt Readings from Last 3 Encounters:  09/03/16 121 lb 3.2 oz (55 kg)  09/03/16 121 lb 3.2 oz (55 kg)  06/04/16 120 lb (54.4 kg)    Current diet: in general, a "healthy" diet    ------------------------------------------------------------------------ Past Medical History:  Diagnosis Date  . Arthritis   . GERD (gastroesophageal reflux disease)   . Hypertension    Patient Active Problem List   Diagnosis Date Noted  . Parkinson's disease (HCC) 06/04/2016  . Abdominal pain 02/07/2016  . Cholelithiasis 01/30/2016  . Palpitations 01/24/2016  . Shortness of breath 01/24/2016  . Epigastric pain   . SBO (small bowel obstruction) 01/13/2016  . Tremor of both hands 10/03/2015  . Arthritis 01/02/2015  . Acid reflux 01/02/2015  . Benign  hypertension 01/02/2015  . Dizziness 01/02/2015  . Essential (primary) hypertension 01/02/2015  . Gastro-esophageal reflux disease without esophagitis 01/02/2015  . Lax, ligament 11/09/2014   Past Surgical History:  Procedure Laterality Date  . HAND SURGERY Right 04/2014  . TONSILLECTOMY     Family History  Problem Relation Age of Onset  . Heart disease Mother   . Arthritis Mother   . Heart disease Father   . Stroke Father    Allergies  Allergen Reactions  . Penicillins     patient unsure of reaction  . Streptomycin Rash     Previous Medications   ASPIRIN 81 MG EC TABLET    Take 1 tablet (81 mg total) by mouth daily. Swallow whole.   CARBIDOPA-LEVODOPA (SINEMET IR) 25-100 MG TABLET    Take 1 tablet by mouth 3 (three) times daily.    CHOLECALCIFEROL (VITAMIN D) 1000 UNITS TABLET    Take 1,000 Units by mouth daily.   FAMOTIDINE (PEPCID) 40 MG TABLET    Take 40 mg by mouth daily.   MAGNESIUM 500 MG CAPS    Take by mouth.   METOPROLOL SUCCINATE (TOPROL-XL) 50 MG 24 HR TABLET    TAKE ONE TABLET BY MOUTH ONCE DAILY TAKE  WITH  OR  IMMEDIATELY  FOLLOWING  A  MEAL   VITAMIN B-12 (CYANOCOBALAMIN) 1000 MCG TABLET    Take 1,000 mcg by mouth daily.   VITAMIN C (ASCORBIC ACID) 500 MG TABLET    Take by mouth.   VITAMIN E 400  UNIT CAPSULE    Take 400 Units by mouth daily.    Review of Systems  Constitutional: Negative.   Respiratory: Negative.   Cardiovascular: Negative.   Neurological: Positive for tremors.    Social History  Substance Use Topics  . Smoking status: Never Smoker  . Smokeless tobacco: Never Used  . Alcohol use No   Objective:   BP (!) 158/82   Pulse 60   Temp 99.2 F (37.3 C) (Oral)   Wt 121 lb 3.2 oz (55 kg)   BMI 21.47 kg/m    Physical Exam  Constitutional: She appears well-developed and well-nourished.  HENT:  Head: Normocephalic.  Right Ear: External ear normal.  Left Ear: External ear normal.  Nose: Nose normal.  Mouth/Throat: Oropharynx is  clear and moist. No oropharyngeal exudate.  Eyes: Conjunctivae and EOM are normal. Pupils are equal, round, and reactive to light.  Neck: Neck supple. No thyromegaly present.  Cardiovascular: Normal rate, regular rhythm and normal heart sounds.   Pulmonary/Chest: Effort normal and breath sounds normal.  Abdominal: Soft. Bowel sounds are normal.  Musculoskeletal: She exhibits no tenderness.  Some stiffness in the left shoulder without click or pop. Good pulses throughout. No pain or stiffness in knees or hips. Unchanged stiffness in fingers and prominent MCP joints from past surgery on the right hand.  Lymphadenopathy:    She has no cervical adenopathy.      Assessment & Plan:     1. Essential (primary) hypertension Tolerating Metoprolol Succinate 5- mg one tablet qd. No longer on the [place] and feeling well. Recheck routine labs and follow up pending reports. - CBC with Differential/Platelet - Comprehensive metabolic panel  2. Parkinson's disease (HCC) Tolerating Sinemet and followed by Dr. Sherryll Burger (neurologist). Recheck labs and proceed with titration of medication as recommended by Dr. Sherryll Burger. Next appointment 09-16-16. - CBC with Differential/Platelet - Comprehensive metabolic panel  3. Arthritis Unchanged decreased grip strength but having some ache and stiffness in the left shoulder. Can reach up to shoulder level but not above with the left arm. No swelling or redness in joints. Will recheck labs. - CBC with Differential/Platelet - Comprehensive metabolic panel

## 2016-09-03 NOTE — Patient Instructions (Signed)
[name] , Thank you for taking time to come for your Medicare Wellness Visit. I appreciate your ongoing commitment to your health goals. Please review the following plan we discussed and let me know if I can assist you in the future.   Screening recommendations/referrals: Colonoscopy: denied Mammogram: denied Bone Density: ordered today Recommended yearly ophthalmology/optometry visit for glaucoma screening and checkup Recommended yearly dental visit for hygiene and checkup  Vaccinations: Influenza vaccine: denied Pneumococcal vaccine: denied Tdap vaccine: denied Shingles vaccine: denied    Advanced directives: denied  Conditions/risks identified: fall risk prevention  Next appointment: None   Preventive Care 65 Years and Older, Female Preventive care refers to lifestyle choices and visits with your health care provider that can promote health and wellness. What does preventive care include?  A yearly physical exam. This is also called an annual well check.  Dental exams once or twice a year.  Routine eye exams. Ask your health care provider how often you should have your eyes checked.  Personal lifestyle choices, including:  Daily care of your teeth and gums.  Regular physical activity.  Eating a healthy diet.  Avoiding tobacco and drug use.  Limiting alcohol use.  Practicing safe sex.  Taking low-dose aspirin every day.  Taking vitamin and mineral supplements as recommended by your health care provider. What happens during an annual well check? The services and screenings done by your health care provider during your annual well check will depend on your age, overall health, lifestyle risk factors, and family history of disease. Counseling  Your health care provider may ask you questions about your:  Alcohol use.  Tobacco use.  Drug use.  Emotional well-being.  Home and relationship well-being.  Sexual activity.  Eating habits.  History of  falls.  Memory and ability to understand (cognition).  Work and work Astronomer.  Reproductive health. Screening  You may have the following tests or measurements:  Height, weight, and BMI.  Blood pressure.  Lipid and cholesterol levels. These may be checked every 5 years, or more frequently if you are over 81 years old.  Skin check.  Lung cancer screening. You may have this screening every year starting at age 72 if you have a 30-pack-year history of smoking and currently smoke or have quit within the past 15 years.  Fecal occult blood test (FOBT) of the stool. You may have this test every year starting at age 86.  Flexible sigmoidoscopy or colonoscopy. You may have a sigmoidoscopy every 5 years or a colonoscopy every 10 years starting at age 77.  Hepatitis C blood test.  Hepatitis B blood test.  Sexually transmitted disease (STD) testing.  Diabetes screening. This is done by checking your blood sugar (glucose) after you have not eaten for a while (fasting). You may have this done every 1-3 years.  Bone density scan. This is done to screen for osteoporosis. You may have this done starting at age 69.  Mammogram. This may be done every 1-2 years. Talk to your health care provider about how often you should have regular mammograms. Talk with your health care provider about your test results, treatment options, and if necessary, the need for more tests. Vaccines  Your health care provider may recommend certain vaccines, such as:  Influenza vaccine. This is recommended every year.  Tetanus, diphtheria, and acellular pertussis (Tdap, Td) vaccine. You may need a Td booster every 10 years.  Zoster vaccine. You may need this after age 53.  Pneumococcal 13-valent conjugate (  PCV13) vaccine. One dose is recommended after age 31.  Pneumococcal polysaccharide (PPSV23) vaccine. One dose is recommended after age 49. Talk to your health care provider about which screenings and  vaccines you need and how often you need them. This information is not intended to replace advice given to you by your health care provider. Make sure you discuss any questions you have with your health care provider. Document Released: 06/16/2015 Document Revised: 02/07/2016 Document Reviewed: 03/21/2015 Elsevier Interactive Patient Education  2017 [place] Prevention in the Home Falls can cause injuries. They can happen to people of all ages. There are many things you can do to make your home safe and to help prevent falls. What can I do on the outside of my home?  Regularly fix the edges of walkways and driveways and fix any cracks.  Remove anything that might make you trip as you walk through a door, such as a raised step or threshold.  Trim any bushes or trees on the path to your home.  Use bright outdoor lighting.  Clear any walking paths of anything that might make someone trip, such as rocks or tools.  Regularly check to see if handrails are loose or broken. Make sure that both sides of any steps have handrails.  Any raised decks and porches should have guardrails on the edges.  Have any leaves, snow, or ice cleared regularly.  Use sand or salt on walking paths during winter.  Clean up any spills in your garage right away. This includes oil or grease spills. What can I do in the bathroom?  Use night lights.  Install grab bars by the toilet and in the tub and shower. Do not use towel bars as grab bars.  Use non-skid mats or decals in the tub or shower.  If you need to sit down in the shower, use a plastic, non-slip stool.  Keep the floor dry. Clean up any water that spills on the floor as soon as it happens.  Remove soap buildup in the tub or shower regularly.  Attach bath mats securely with double-sided non-slip rug tape.  Do not have throw rugs and other things on the floor that can make you trip. What can I do in the bedroom?  Use night  lights.  Make sure that you have a light by your bed that is easy to reach.  Do not use any sheets or blankets that are too big for your bed. They should not hang down onto the floor.  Have a firm chair that has side arms. You can use this for support while you get dressed.  Do not have throw rugs and other things on the floor that can make you trip. What can I do in the kitchen?  Clean up any spills right away.  Avoid walking on wet floors.  Keep items that you use a lot in easy-to-reach places.  If you need to reach something above you, use a strong step stool that has a grab bar.  Keep electrical cords out of the way.  Do not use floor polish or wax that makes floors slippery. If you must use wax, use non-skid floor wax.  Do not have throw rugs and other things on the floor that can make you trip. What can I do with my stairs?  Do not leave any items on the stairs.  Make sure that there are handrails on both sides of the stairs and use them. Fix handrails that  are broken or loose. Make sure that handrails are as long as the stairways.  Check any carpeting to make sure that it is firmly attached to the stairs. Fix any carpet that is loose or worn.  Avoid having throw rugs at the top or bottom of the stairs. If you do have throw rugs, attach them to the floor with carpet tape.  Make sure that you have a light switch at the top of the stairs and the bottom of the stairs. If you do not have them, ask someone to add them for you. What else can I do to help prevent falls?  Wear shoes that:  Do not have high heels.  Have rubber bottoms.  Are comfortable and fit you well.  Are closed at the toe. Do not wear sandals.  If you use a stepladder:  Make sure that it is fully opened. Do not climb a closed stepladder.  Make sure that both sides of the stepladder are locked into place.  Ask someone to hold it for you, if possible.  Clearly mark and make sure that you can  see:  Any grab bars or handrails.  First and last steps.  Where the edge of each step is.  Use tools that help you move around (mobility aids) if they are needed. These include:  Canes.  Walkers.  Scooters.  Crutches.  Turn on the lights when you go into a dark area. Replace any light bulbs as soon as they burn out.  Set up your furniture so you have a clear path. Avoid moving your furniture around.  If any of your floors are uneven, fix them.  If there are any pets around you, be aware of where they are.  Review your medicines with your doctor. Some medicines can make you feel dizzy. This can increase your chance of falling. Ask your doctor what other things that you can do to help prevent falls. This information is not intended to replace advice given to you by your health care provider. Make sure you discuss any questions you have with your health care provider. Document Released: 03/16/2009 Document Revised: 10/26/2015 Document Reviewed: 06/24/2014 Elsevier Interactive Patient Education  2017 [employer].

## 2016-09-03 NOTE — Progress Notes (Signed)
Subjective:   Destiny Torres is a 71 y.o. female who presents for an Initial Medicare Annual Wellness Visit.  Review of Systems    N/A  Cardiac Risk Factors include: advanced age (>73men, >21 women);hypertension     Objective:    Today's Vitals   09/03/16 1302  BP: (!) 158/82  Pulse: 60  Temp: 99.2 F (37.3 C)  TempSrc: Oral  Weight: 121 lb 3.2 oz (55 kg)  Height:  (1.6 m)  PainSc: 0-No pain   Body mass index is 21.47 kg/m.   Current Medications (verified) Outpatient Encounter Prescriptions as of 09/03/2016  Medication Sig  . carbidopa-levodopa (SINEMET IR) 25-100 MG tablet Take 1 tablet by mouth 3 (three) times daily.   . cholecalciferol (VITAMIN D) 1000 units tablet Take 1,000 Units by mouth daily.  . Magnesium 500 MG CAPS Take by mouth.  . metoprolol succinate (TOPROL-XL) 50 MG 24 hr tablet TAKE ONE TABLET BY MOUTH ONCE DAILY TAKE  WITH  OR  IMMEDIATELY  FOLLOWING  A  MEAL (Patient taking differently: TAKE 1/2 TABLET BY MOUTH ONCE DAILY TAKE  WITH  OR  IMMEDIATELY  FOLLOWING  A  MEAL)  . vitamin B-12 (CYANOCOBALAMIN) 1000 MCG tablet Take 1,000 mcg by mouth daily.  . vitamin C (ASCORBIC ACID) 500 MG tablet Take by mouth.  . vitamin E 400 UNIT capsule Take 400 Units by mouth daily.  Marland Kitchen aspirin 81 MG EC tablet Take 1 tablet (81 mg total) by mouth daily. Swallow whole. (Patient not taking: Reported on 09/03/2016)  . famotidine (PEPCID) 40 MG tablet Take 40 mg by mouth daily.   No facility-administered encounter medications on file as of 09/03/2016.     Allergies (verified) Penicillins and Streptomycin   History: Past Medical History:  Diagnosis Date  . Arthritis   . GERD (gastroesophageal reflux disease)   . Hypertension    Past Surgical History:  Procedure Laterality Date  . HAND SURGERY Right 04/2014  . TONSILLECTOMY     Family History  Problem Relation Age of Onset  . Heart disease Mother   . Arthritis Mother   . Heart disease Father   . Stroke Father     Social History   Occupational History  . Not on file.   Social History Main Topics  . Smoking status: Never Smoker  . Smokeless tobacco: Never Used  . Alcohol use No  . Drug use: No  . Sexual activity: No    Tobacco Counseling Counseling given: Not Answered   Activities of Daily Living In your present state of health, do you have any difficulty performing the following activities: 09/03/2016 01/14/2016  Hearing? N N  Vision? N N  Difficulty concentrating or making decisions? Y N  Walking or climbing stairs? N N  Dressing or bathing? N Y  Doing errands, shopping? N Y  Quarry manager and eating ? N -  Using the Toilet? N -  In the past six months, have you accidently leaked urine? N -  Do you have problems with loss of bowel control? N -  Managing your Medications? N -  Managing your Finances? N -  Housekeeping or managing your Housekeeping? N -  Some recent data might be hidden    Immunizations and Health Maintenance  There is no immunization history on file for this patient. There are no preventive care reminders to display for this patient.  Patient Care Team: Tamsen Roers, PA as PCP - General (Physician Assistant) Lonell Face,  MD as Consulting Physician (NLamar Blinksce J Kowalski, MD as Consulting Physician (Cardiology) Jimmye Norman, NP as Nurse Practitioner (Neurology)  Indicate any recent Medical Services you may have received from other than [facility] providers in the past year (date may be approximate).     Assessment:   This is a routine wellness examination for Charitie.   Hearing/Vision screen Vision Screening Comments: Pt does not follow up with an eye doctor on a regular basis. Has been >5 years since last visit. Pt has no complaints today.  Dietary issues and exercise activities discussed: Current Exercise Habits: The patient does not participate in regular exercise at present, Exercise limited by: None identified  Goals    .  Exercise          Recommend walking 3 days a week for 20-30 minutes at a time.       Depression Screen PHQ 2/9 Scores 09/03/2016  PHQ - 2 Score 2  PHQ- 9 Score 5    Fall Risk Fall Risk  09/03/2016  Falls in the past year? Yes  Number falls in past yr: 2 or more  Injury with Fall? Yes  Risk for fall due to : Other (Comment)  Risk for fall due to (comments): parkinsons  Follow up Falls prevention discussed    Cognitive Function:     6CIT Screen 09/03/2016  What Year? 0 points  What month? 0 points  What time? 0 points  Count back from 20 0 points  Months in reverse 0 points  Repeat phrase 4 points  Total Score 4    Screening Tests Health Maintenance  Topic Date Due  . INFLUENZA VACCINE  04/09/2017 (Originally 01/01/2017)  . MAMMOGRAM  06/03/2026 (Originally 12/01/1993)  . COLONOSCOPY  06/03/2026 (Originally 12/01/1993)  . TETANUS/TDAP  06/03/2026 (Originally 12/02/1962)  . PNA vac Low Risk Adult (1 of 2 - PCV13) 06/03/2026 (Originally 12/01/2008)  . DEXA SCAN  Completed  . Hepatitis C Screening  Completed      Plan:  I have personally reviewed and addressed the Medicare Annual Wellness questionnaire and have noted the following in the patient's chart:  A. Medical and social history B. Use of alcohol, tobacco or illicit drugs  C. Current medications and supplements D. Functional ability and status E.  Nutritional status F.  Physical activity G. Advance directives H. List of other physicians I.  Hospitalizations, surgeries, and ER visits in previous 12 months J.  Vitals K. Screenings such as hearing and vision if needed, cognitive and depression L. Referrals and appointments - none  In addition, I have reviewed and discussed with patient certain preventive protocols, quality metrics, and best practice recommendations. A written personalized care plan for preventive services as well as general preventive health recommendations were provided to patient.  See attached scanned  questionnaire for additional information.   Signed,  Hyacinth Meeker, LPN Nurse Health Advisor   MD Recommendations: None. Pt declined colonoscopy, mammogram, pneumonia vaccine, tetanus vaccine and influenza.    Reviewed Nurse Health Advisor documentation and recommendations. Agree with notes and plan.

## 2016-09-04 LAB — COMPREHENSIVE METABOLIC PANEL
ALK PHOS: 98 IU/L (ref 39–117)
ALT: 6 IU/L (ref 0–32)
AST: 24 IU/L (ref 0–40)
Albumin/Globulin Ratio: 1.4 (ref 1.2–2.2)
Albumin: 3.9 g/dL (ref 3.5–4.8)
BUN/Creatinine Ratio: 15 (ref 12–28)
BUN: 11 mg/dL (ref 8–27)
Bilirubin Total: 0.6 mg/dL (ref 0.0–1.2)
CALCIUM: 9.5 mg/dL (ref 8.7–10.3)
CO2: 25 mmol/L (ref 18–29)
CREATININE: 0.72 mg/dL (ref 0.57–1.00)
Chloride: 97 mmol/L (ref 96–106)
GFR calc Af Amer: 97 mL/min/{1.73_m2} (ref >59)
GFR, EST NON AFRICAN AMERICAN: 84 mL/min/{1.73_m2} (ref >59)
Globulin, Total: 2.7 g/dL (ref 1.5–4.5)
Glucose: 86 mg/dL (ref 65–99)
POTASSIUM: 4.3 mmol/L (ref 3.5–5.2)
Sodium: 139 mmol/L (ref 134–144)
Total Protein: 6.6 g/dL (ref 6.0–8.5)

## 2016-09-04 LAB — CBC WITH DIFFERENTIAL/PLATELET
BASOS ABS: 0 10*3/uL (ref 0.0–0.2)
Basos: 0 %
EOS (ABSOLUTE): 0.1 10*3/uL (ref 0.0–0.4)
Eos: 1 %
Hematocrit: 39.2 % (ref 34.0–46.6)
Hemoglobin: 13.1 g/dL (ref 11.1–15.9)
IMMATURE GRANULOCYTES: 1 %
Immature Grans (Abs): 0 10*3/uL (ref 0.0–0.1)
Lymphocytes Absolute: 2.8 10*3/uL (ref 0.7–3.1)
Lymphs: 36 %
MCH: 27.9 pg (ref 26.6–33.0)
MCHC: 33.4 g/dL (ref 31.5–35.7)
MCV: 83 fL (ref 79–97)
Monocytes Absolute: 0.6 10*3/uL (ref 0.1–0.9)
Monocytes: 7 %
NEUTROS PCT: 55 %
Neutrophils Absolute: 4.3 10*3/uL (ref 1.4–7.0)
PLATELETS: 285 10*3/uL (ref 150–379)
RBC: 4.7 x10E6/uL (ref 3.77–5.28)
RDW: 14.5 % (ref 12.3–15.4)
WBC: 7.8 10*3/uL (ref 3.4–10.8)

## 2016-09-04 LAB — HEPATITIS C ANTIBODY: HEP C VIRUS AB: 0.2 {s_co_ratio} (ref 0.0–0.9)

## 2016-09-16 DIAGNOSIS — R2681 Unsteadiness on feet: Secondary | ICD-10-CM | POA: Diagnosis not present

## 2016-09-16 DIAGNOSIS — R4189 Other symptoms and signs involving cognitive functions and awareness: Secondary | ICD-10-CM | POA: Diagnosis not present

## 2016-09-16 DIAGNOSIS — G20 Parkinson's disease: Secondary | ICD-10-CM | POA: Diagnosis not present

## 2016-09-24 ENCOUNTER — Other Ambulatory Visit: Payer: Self-pay | Admitting: Family Medicine

## 2016-09-24 DIAGNOSIS — I10 Essential (primary) hypertension: Secondary | ICD-10-CM

## 2016-09-24 MED ORDER — METOPROLOL SUCCINATE ER 50 MG PO TB24: ORAL_TABLET | ORAL | 1 refills | Status: DC

## 2016-09-24 NOTE — Telephone Encounter (Signed)
Pt contacted office for refill request on the following medications:  metoprolol succinate (TOPROL-XL) 50 MG 24 hr tablet.  [facility] [address]  ZO#[phone]/[id number]

## 2016-10-14 ENCOUNTER — Ambulatory Visit
Admission: RE | Admit: 2016-10-14 | Discharge: 2016-10-14 | Disposition: A | Discharge status: Home / Self Care | Payer: Medicare HMO | Source: Ambulatory Visit | Attending: Family Medicine | Admitting: Family Medicine

## 2016-10-14 ENCOUNTER — Other Ambulatory Visit: Payer: Medicare HMO

## 2016-10-14 ENCOUNTER — Encounter: Payer: Self-pay | Admitting: Family Medicine

## 2016-10-14 ENCOUNTER — Ambulatory Visit (INDEPENDENT_AMBULATORY_CARE_PROVIDER_SITE_OTHER): Payer: Medicare HMO | Admitting: Family Medicine

## 2016-10-14 ENCOUNTER — Ambulatory Visit: Payer: Medicare HMO

## 2016-10-14 VITALS — BP 136/60 | HR 62 | Temp 98.3°F | Resp 16 | Wt 116.0 lb

## 2016-10-14 DIAGNOSIS — G20 Parkinson's disease: Secondary | ICD-10-CM | POA: Diagnosis not present

## 2016-10-14 DIAGNOSIS — S79912A Unspecified injury of left hip, initial encounter: Secondary | ICD-10-CM | POA: Diagnosis not present

## 2016-10-14 DIAGNOSIS — T07XXXA Unspecified multiple injuries, initial encounter: Secondary | ICD-10-CM

## 2016-10-14 DIAGNOSIS — M25522 Pain in left elbow: Secondary | ICD-10-CM | POA: Diagnosis not present

## 2016-10-14 DIAGNOSIS — I10 Essential (primary) hypertension: Secondary | ICD-10-CM | POA: Diagnosis not present

## 2016-10-14 DIAGNOSIS — W19XXXA Unspecified fall, initial encounter: Secondary | ICD-10-CM | POA: Diagnosis not present

## 2016-10-14 DIAGNOSIS — M79642 Pain in left hand: Secondary | ICD-10-CM | POA: Diagnosis not present

## 2016-10-14 DIAGNOSIS — S59902A Unspecified injury of left elbow, initial encounter: Secondary | ICD-10-CM | POA: Diagnosis not present

## 2016-10-14 DIAGNOSIS — S6991XA Unspecified injury of right wrist, hand and finger(s), initial encounter: Secondary | ICD-10-CM | POA: Diagnosis not present

## 2016-10-14 DIAGNOSIS — M19012 Primary osteoarthritis, left shoulder: Secondary | ICD-10-CM | POA: Diagnosis not present

## 2016-10-14 DIAGNOSIS — M25512 Pain in left shoulder: Secondary | ICD-10-CM | POA: Diagnosis not present

## 2016-10-14 DIAGNOSIS — M25552 Pain in left hip: Secondary | ICD-10-CM | POA: Diagnosis not present

## 2016-10-14 DIAGNOSIS — S4992XA Unspecified injury of left shoulder and upper arm, initial encounter: Secondary | ICD-10-CM | POA: Diagnosis not present

## 2016-10-14 NOTE — Patient Instructions (Signed)
Contusion A contusion is a deep bruise. Contusions are the result of a blunt injury to tissues and muscle fibers under the skin. The injury causes bleeding under the skin. The skin overlying the contusion may turn blue, purple, or yellow. Minor injuries will give you a painless contusion, but more severe contusions may stay painful and swollen for a few weeks. What are the causes? This condition is usually caused by a blow, trauma, or direct force to an area of the body. What are the signs or symptoms? Symptoms of this condition include:  Swelling of the injured area.  Pain and tenderness in the injured area.  Discoloration. The area may have redness and then turn blue, purple, or yellow. How is this diagnosed? This condition is diagnosed based on a physical exam and medical history. An X-ray, CT scan, or MRI may be needed to determine if there are any associated injuries, such as broken bones (fractures). How is this treated? Specific treatment for this condition depends on what area of the body was injured. In general, the best treatment for a contusion is resting, icing, applying pressure to (compression), and elevating the injured area. This is often called the RICE strategy. Over-the-counter anti-inflammatory medicines may also be recommended for pain control. Follow these instructions at home:  Rest the injured area.  If directed, apply ice to the injured area:  Put ice in a plastic bag.  Place a towel between your skin and the bag.  Leave the ice on for 20 minutes, 2-3 times per day.  If directed, apply light compression to the injured area using an elastic bandage. Make sure the bandage is not wrapped too tightly. Remove and reapply the bandage as directed by your health care provider.  If possible, raise (elevate) the injured area above the level of your heart while you are sitting or lying down.  Take over-the-counter and prescription medicines only as told by your health  care provider. Contact a health care provider if:  Your symptoms do not improve after several days of treatment.  Your symptoms get worse.  You have difficulty moving the injured area. Get help right away if:  You have severe pain.  You have numbness in a hand or foot.  Your hand or foot turns pale or cold. This information is not intended to replace advice given to you by your health care provider. Make sure you discuss any questions you have with your health care provider. Document Released: 02/27/2005 Document Revised: 09/28/2015 Document Reviewed: 10/05/2014 Elsevier Interactive Patient Education  2017 [employer]. Fall Prevention in the Home Falls can cause injuries and can affect people from all age groups. There are many simple things that you can do to make your home safe and to help prevent falls. What can I do on the outside of my home?  Regularly repair the edges of walkways and driveways and fix any cracks.  Remove high doorway thresholds.  Trim any shrubbery on the main path into your home.  Use bright outdoor lighting.  Clear walkways of debris and clutter, including tools and rocks.  Regularly check that handrails are securely fastened and in good repair. Both sides of any steps should have handrails.  Install guardrails along the edges of any raised decks or porches.  Have leaves, snow, and ice cleared regularly.  Use sand or salt on walkways during winter months.  In the garage, clean up any spills right away, including grease or oil spills. What can I do in  the bathroom?  Use night lights.  Install grab bars by the toilet and in the tub and shower. Do not use towel bars as grab bars.  Use non-skid mats or decals on the floor of the tub or shower.  If you need to sit down while you are in the shower, use a plastic, non-slip stool.  Keep the floor dry. Immediately clean up any water that spills on the floor.  Remove soap buildup in the tub or  shower on a regular basis.  Attach bath mats securely with double-sided non-slip rug tape.  Remove throw rugs and other tripping hazards from the floor. What can I do in the bedroom?  Use night lights.  Make sure that a bedside light is easy to reach.  Do not use oversized bedding that drapes onto the floor.  Have a firm chair that has side arms to use for getting dressed.  Remove throw rugs and other tripping hazards from the floor. What can I do in the kitchen?  Clean up any spills right away.  Avoid walking on wet floors.  Place frequently used items in easy-to-reach places.  If you need to reach for something above you, use a sturdy step stool that has a grab bar.  Keep electrical cables out of the way.  Do not use floor polish or wax that makes floors slippery. If you have to use wax, make sure that it is non-skid floor wax.  Remove throw rugs and other tripping hazards from the floor. What can I do in the stairways?  Do not leave any items on the stairs.  Make sure that there are handrails on both sides of the stairs. Fix handrails that are broken or loose. Make sure that handrails are as long as the stairways.  Check any carpeting to make sure that it is firmly attached to the stairs. Fix any carpet that is loose or worn.  Avoid having throw rugs at the top or bottom of stairways, or secure the rugs with carpet tape to prevent them from moving.  Make sure that you have a light switch at the top of the stairs and the bottom of the stairs. If you do not have them, have them installed. What are some other fall prevention tips?  Wear closed-toe shoes that fit well and support your feet. Wear shoes that have rubber soles or low heels.  When you use a stepladder, make sure that it is completely opened and that the sides are firmly locked. Have someone hold the ladder while you are using it. Do not climb a closed stepladder.  Add color or contrast paint or tape to grab  bars and handrails in your home. Place contrasting color strips on the first and last steps.  Use mobility aids as needed, such as canes, walkers, scooters, and crutches.  Turn on lights if it is dark. Replace any light bulbs that burn out.  Set up furniture so that there are clear paths. Keep the furniture in the same spot.  Fix any uneven floor surfaces.  Choose a carpet design that does not hide the edge of steps of a stairway.  Be aware of any and all pets.  Review your medicines with your healthcare provider. Some medicines can cause dizziness or changes in blood pressure, which increase your risk of falling. Talk with your health care provider about other ways that you can decrease your risk of falls. This may include working with a physical therapist or trainer to  improve your strength, balance, and endurance. This information is not intended to replace advice given to you by your health care provider. Make sure you discuss any questions you have with your health care provider. Document Released: 05/10/2002 Document Revised: 10/17/2015 Document Reviewed: 06/24/2014 Elsevier Interactive Patient Education  2017 [employer].

## 2016-10-14 NOTE — Progress Notes (Signed)
Patient: Destiny MoutonGlenda Primiano Female    DOB: 06/14/1945   71 y.o.   MRN: [id number] Visit Date: 10/14/2016  Today's Provider: Dortha Kernennis Chrismon, PA   Chief Complaint  Patient presents with  . Fall   Subjective:    Fall  The accident occurred 3 to 5 days ago (3 days ago). The fall occurred while walking (patient reports that she was outside and tripped on the sidewalk while trying to get to her car). She landed on concrete. There was no blood loss. The point of impact was the head and left elbow. The pain is present in the head, left elbow, left lower arm, left hand, left wrist, left upper leg and left lower leg. The pain is at a severity of 6/10. The pain is moderate. Associated symptoms include headaches. She has tried nothing for the symptoms.   Patient reports that she normally has dizzy spells, and she does not know if the dizziness caused her fall. She reports that she also has parkinson's disease, and she has been taking Sinemet 4 times a day instead of 3 times daily.     Allergies  Allergen Reactions  . Penicillins     patient unsure of reaction  . Streptomycin Rash     Current Outpatient Prescriptions:  .  aspirin 81 MG EC tablet, Take 1 tablet (81 mg total) by mouth daily. Swallow whole. (Patient not taking: Reported on 09/03/2016), Disp: 30 tablet, Rfl: 12 .  carbidopa-levodopa (SINEMET IR) 25-100 MG tablet, Take 1 tablet by mouth 3 (three) times daily. , Disp: , Rfl:  .  cholecalciferol (VITAMIN D) 1000 units tablet, Take 1,000 Units by mouth daily., Disp: , Rfl:  .  famotidine (PEPCID) 40 MG tablet, Take 40 mg by mouth daily., Disp: , Rfl:  .  Magnesium 500 MG CAPS, Take by mouth., Disp: , Rfl:  .  metoprolol succinate (TOPROL-XL) 50 MG 24 hr tablet, TAKE ONE TABLET BY MOUTH ONCE DAILY TAKE  WITH  OR  IMMEDIATELY  FOLLOWING  A  MEAL, Disp: 90 tablet, Rfl: 1 .  vitamin B-12 (CYANOCOBALAMIN) 1000 MCG tablet, Take 1,000 mcg by mouth daily., Disp: , Rfl:  .  vitamin C  (ASCORBIC ACID) 500 MG tablet, Take by mouth., Disp: , Rfl:  .  vitamin E 400 UNIT capsule, Take 400 Units by mouth daily., Disp: , Rfl:   Review of Systems  Constitutional: Positive for activity change and fatigue.  Cardiovascular: Positive for leg swelling. Negative for chest pain and palpitations.  Musculoskeletal: Positive for arthralgias, back pain, joint swelling and myalgias.  Neurological: Positive for headaches.    Social History  Substance Use Topics  . Smoking status: Never Smoker  . Smokeless tobacco: Never Used  . Alcohol use No   Objective:   BP 136/60 (BP Location: Right Arm, Patient Position: Sitting, Cuff Size: Normal)   Pulse 62   Temp 98.3 F (36.8 C)   Resp 16   Wt 116 lb (52.6 kg)   SpO2 98%   BMI 20.55 kg/m  Vitals:   10/14/16 1406  BP: 136/60  Pulse: 62  Resp: 16  Temp: 98.3 F (36.8 C)  SpO2: 98%  Weight: 116 lb (52.6 kg)   BP Readings from Last 3 Encounters:  10/14/16 136/60  09/03/16 (!) 152/76  09/03/16 (!) 158/82    Physical Exam  Constitutional: She is oriented to person, place, and time. She appears well-developed and well-nourished.  HENT:  Head: Normocephalic.  Right  Ear: External ear normal.  Left Ear: External ear normal.  Nose: Nose normal.  Mouth/Throat: Oropharynx is clear and moist.  Eyes: Conjunctivae are normal. Pupils are equal, round, and reactive to light.  Neck: Neck supple.  Cardiovascular: Normal rate and regular rhythm.   Pulmonary/Chest: Effort normal and breath sounds normal.  Abdominal: Soft. Bowel sounds are normal.  Musculoskeletal: She exhibits tenderness.  Bruising and pain in left fingers, elbow and shoulder. Some swelling in the upper posterior thigh with aching with shooting pains. Good pulses. Enlarged MCP joints both hands with some finger deformities and shortened extensor tendons of all fingers.  Lymphadenopathy:    She has no cervical adenopathy.  Neurological: She is alert and oriented to person,  place, and time.      Assessment & Plan:     1. Fall, initial encounter  Was walking in a parking lot and tripped on the sidewalk falling on to the concrete on 09-11-16. Was having pain in the left elbow, left hand/wrist, left shoulder and left hip. She did not seek medical attention until today. Multiple bruises. Will check x-rays of areas of pain to rule out fractures in this individual with arthritis, slight build and suspected osteopenia. May use Tylenol or Advil prn discomfort. Recheck pending x-ray reports. - DG Hand Complete Left - DG Elbow Complete Left - DG Shoulder Left - DG HIP UNILAT WITH PELVIS 2-3 VIEWS LEFT  2. Multiple contusions Bruises on several arthritic fingers, left hand, left elbow and some excoriations of the left lower anterior thigh and on face. Will get x-ray evaluation. No LOC, vision changes, headaches or nausea.  - DG Hand Complete Left - DG Elbow Complete Left - DG Shoulder Left  3. Parkinson's disease [facility]) Neurologist has changed Sinemet from one tablet TID to QID in January. Still has some tremor occasionally and weakness with some dizzy spells. Probably needs follow up with neurologist (Dr. Sherryll Burger) if not stable on this new dosage (had not been taking it QID regularly until the past 4 days)..  4. Essential (primary) hypertension BP lower than in the past with possible orthostatic hypotensive episodes contributing to her dizziness. With the possible decrease in BP from the Sinemet dosage increase, will decrease the Metoprolol Succinate 50 mg qd to 1/2 tablet qd. Recheck BP at home and call report of readings.       Dortha Kern, PA  [facility]

## 2016-10-15 ENCOUNTER — Telehealth: Payer: Self-pay

## 2016-10-15 NOTE — Telephone Encounter (Signed)
-----   Message from Tamsen Roersennis E Chrismon, [place] sent at 10/14/2016  5:33 PM EDT ----- Degenerative changes in hip joints and lumbar spine. No acute fractures.

## 2016-10-15 NOTE — Telephone Encounter (Signed)
Left message informing pt per DPR. Anarie Kalish Drozdowski, CMA  

## 2016-10-15 NOTE — Telephone Encounter (Signed)
-----   Message from Tamsen Roersennis E Chrismon, [place] sent at 10/14/2016  5:02 PM EDT ----- Some degenerative changes in navicular and lesser multangular joint. Mild degenerative changes in left elbow. Degenerative changes/arthritis in the left shoulder. No acute fractures. Awaiting final report of left hip x-rays.

## 2016-10-15 NOTE — Telephone Encounter (Signed)
Left message advising pt per DPR. Emily Drozdowski, CMA  

## 2016-11-19 ENCOUNTER — Telehealth: Payer: Self-pay | Admitting: Family Medicine

## 2016-11-19 NOTE — Telephone Encounter (Signed)
Pt is requesting a call back. 

## 2016-11-19 NOTE — Telephone Encounter (Signed)
Error/MW °

## 2016-12-04 DIAGNOSIS — S40012A Contusion of left shoulder, initial encounter: Secondary | ICD-10-CM | POA: Diagnosis not present

## 2016-12-04 DIAGNOSIS — G20 Parkinson's disease: Secondary | ICD-10-CM | POA: Diagnosis not present

## 2016-12-04 DIAGNOSIS — S7002XA Contusion of left hip, initial encounter: Secondary | ICD-10-CM | POA: Diagnosis not present

## 2016-12-23 ENCOUNTER — Telehealth: Payer: Self-pay | Admitting: Family Medicine

## 2016-12-23 NOTE — Telephone Encounter (Signed)
Advised patient the Beta Blocker (Toprol) has been used to try to control tremors in Parkinson's Disease. States her neurologist increased her Sinemet dosage a month ago. Tremor has been better controlled. She may try without the Toprol as long as she monitors BP and maintains a level around 140/90 or less. She will call a report of progress in the next week or two.

## 2016-12-23 NOTE — Telephone Encounter (Signed)
Pt is calling wanting to know if she can skip a day on her blood pressure pills.  She is havnig shakiness in the middle of the day.  She is taking parkinson's dx medication also. She is not sure what is causing this feeling.     Her call back is [phone]  Thanks Barth Kirkseri.

## 2017-01-17 DIAGNOSIS — G20 Parkinson's disease: Secondary | ICD-10-CM | POA: Diagnosis not present

## 2017-01-17 DIAGNOSIS — R4189 Other symptoms and signs involving cognitive functions and awareness: Secondary | ICD-10-CM | POA: Diagnosis not present

## 2017-01-17 DIAGNOSIS — G4752 REM sleep behavior disorder: Secondary | ICD-10-CM | POA: Diagnosis not present

## 2017-01-17 DIAGNOSIS — R2681 Unsteadiness on feet: Secondary | ICD-10-CM | POA: Diagnosis not present

## 2017-02-04 ENCOUNTER — Telehealth: Payer: Self-pay | Admitting: Family Medicine

## 2017-02-04 NOTE — Telephone Encounter (Signed)
Pt states she is still having hight BP 150 - 160 but bottom number is about 60-70.  She states she is taking a whole BP pill but is still having dizzy spells early in the morning and though out the day.  Not very many at night.

## 2017-02-04 NOTE — Telephone Encounter (Signed)
Patient advised. She states Sinemet has been increased to 25-250 mg TID and she also advised neurologist of dizziness. She states they may be increasing Sinemet again because current dose is not controlling symptoms some days. Patient will continue with consult with neurologist.

## 2017-02-04 NOTE — Telephone Encounter (Signed)
BP not high, or low, enough to normally create dizziness. Has neurologist adjusted Sinemet dosage for Parkinson's Disease recently? Sometimes dizziness is a side effect of this medication or a sign of progression of the Parkinson's Disease. Drink plenty of fluids and may need to consult with neurologist regarding dizziness.

## 2017-03-24 ENCOUNTER — Emergency Department: Payer: Medicare HMO

## 2017-03-24 ENCOUNTER — Emergency Department
Admission: EM | Admit: 2017-03-24 | Discharge: 2017-03-24 | Disposition: A | Discharge status: Home / Self Care | Payer: Medicare HMO | Attending: Emergency Medicine | Admitting: Emergency Medicine

## 2017-03-24 ENCOUNTER — Encounter: Payer: Self-pay | Admitting: Emergency Medicine

## 2017-03-24 DIAGNOSIS — G20 Parkinson's disease: Secondary | ICD-10-CM | POA: Insufficient documentation

## 2017-03-24 DIAGNOSIS — N390 Urinary tract infection, site not specified: Secondary | ICD-10-CM | POA: Insufficient documentation

## 2017-03-24 DIAGNOSIS — K4090 Unilateral inguinal hernia, without obstruction or gangrene, not specified as recurrent: Secondary | ICD-10-CM | POA: Diagnosis not present

## 2017-03-24 DIAGNOSIS — Z79899 Other long term (current) drug therapy: Secondary | ICD-10-CM | POA: Diagnosis not present

## 2017-03-24 DIAGNOSIS — I10 Essential (primary) hypertension: Secondary | ICD-10-CM | POA: Insufficient documentation

## 2017-03-24 DIAGNOSIS — R079 Chest pain, unspecified: Secondary | ICD-10-CM

## 2017-03-24 LAB — TROPONIN I: Troponin I: <0.03 ng/mL (ref <0.03)

## 2017-03-24 LAB — CBC
HEMATOCRIT: 41.9 % (ref 35.0–47.0)
HEMOGLOBIN: 14.2 g/dL (ref 12.0–16.0)
MCH: 30.1 pg (ref 26.0–34.0)
MCHC: 33.9 g/dL (ref 32.0–36.0)
MCV: 88.8 fL (ref 80.0–100.0)
Platelets: 271 10*3/uL (ref 150–440)
RBC: 4.72 MIL/uL (ref 3.80–5.20)
RDW: 13.7 % (ref 11.5–14.5)
WBC: 8.4 10*3/uL (ref 3.6–11.0)

## 2017-03-24 LAB — URINALYSIS, COMPLETE (UACMP) WITH MICROSCOPIC
Bilirubin Urine: NEGATIVE
GLUCOSE, UA: NEGATIVE mg/dL
Ketones, ur: NEGATIVE mg/dL
NITRITE: NEGATIVE
PROTEIN: NEGATIVE mg/dL
Specific Gravity, Urine: 1.006 (ref 1.005–1.030)
pH: 7 (ref 5.0–8.0)

## 2017-03-24 LAB — BASIC METABOLIC PANEL
ANION GAP: 9 (ref 5–15)
BUN: 20 mg/dL (ref 6–20)
CO2: 25 mmol/L (ref 22–32)
Calcium: 9.1 mg/dL (ref 8.9–10.3)
Chloride: 104 mmol/L (ref 101–111)
Creatinine, Ser: 0.83 mg/dL (ref 0.44–1.00)
Glucose, Bld: 118 mg/dL — ABNORMAL HIGH (ref 65–99)
POTASSIUM: 4.1 mmol/L (ref 3.5–5.1)
SODIUM: 138 mmol/L (ref 135–145)

## 2017-03-24 MED ORDER — CEPHALEXIN 500 MG PO CAPS: 500.0000 mg | ORAL_CAPSULE | Freq: Two times a day (BID) | ORAL | 0 refills | Status: AC

## 2017-03-24 MED ORDER — CEPHALEXIN 500 MG PO CAPS
500.0000 mg | ORAL_CAPSULE | Freq: Once | ORAL | Status: AC
  Administered 2017-03-24: 500 mg via ORAL
  Filled 2017-03-24: qty 1

## 2017-03-24 NOTE — ED Provider Notes (Signed)
[facility] Emergency Department Provider Note ____________________________________________   First MD Initiated Contact with Patient 03/24/17 1953     (approximate)  I have reviewed the triage vital signs and the nursing notes.   HISTORY  Chief Complaint Hypertension    HPI Destiny Torres is a 71 y.o. female with a history of Parkinson's disease who presents with hypertension, acute onset today, measured to 207/100, and associated with a generalized feeling of being unwell. Patient states that she has had chronic symptoms of "not feeling right," which she states she has difficulty clarifying, but states she feels somewhat dizzy and "not like herself."  he states she has had this symptom for months, but today it was somewhat worse. She called her primary care doctor, who instructed her husband over the phone to check her blood pressure, and it was found to be elevated.  Her doctor then instructed her to come to the ED.   Patient denies headache, vision changes, chest pain, difficulty breathing, vomiting, or leg swelling. She denies recent fever or chills. She states she may have some urinary frequency.  She states she is compliant with her blood pressure medications.    Past Medical History:  Diagnosis Date  . Arthritis   . GERD (gastroesophageal reflux disease)   . Hypertension     Patient Active Problem List   Diagnosis Date Noted  . Parkinson's disease (HCC) 06/04/2016  . Abdominal pain 02/07/2016  . Cholelithiasis 01/30/2016  . Palpitations 01/24/2016  . Shortness of breath 01/24/2016  . Epigastric pain   . SBO (small bowel obstruction) (HCC) 01/13/2016  . Tremor of both hands 10/03/2015  . Arthritis 01/02/2015  . Acid reflux 01/02/2015  . Benign hypertension 01/02/2015  . Dizziness 01/02/2015  . Essential (primary) hypertension 01/02/2015  . Gastro-esophageal reflux disease without esophagitis 01/02/2015  . Lax, ligament 11/09/2014     Past Surgical History:  Procedure Laterality Date  . HAND SURGERY Right 04/2014  . TONSILLECTOMY      Prior to Admission medications   Medication Sig Start Date End Date Taking? Authorizing Provider  aspirin 81 MG EC tablet Take 1 tablet (81 mg total) by mouth daily. Swallow whole. Patient not taking: Reported on 09/03/2016 01/22/16   Emily Filbert, MD  carbidopa-levodopa (SINEMET IR) 25-250 MG tablet Take 1 tablet by mouth 3 (three) times daily.  05/22/16 09/03/16  [provider]  cephALEXin (KEFLEX) 500 MG capsule Take 1 capsule (500 mg total) by mouth 2 (two) times daily. 03/25/17 04/04/17  Dionne Bucy, MD  cholecalciferol (VITAMIN D) 1000 units tablet Take 1,000 Units by mouth daily.    [provider]  famotidine (PEPCID) 40 MG tablet Take 40 mg by mouth daily.    [provider]  Magnesium 500 MG CAPS Take by mouth.    [provider]  metoprolol succinate (TOPROL-XL) 50 MG 24 hr tablet TAKE ONE TABLET BY MOUTH ONCE DAILY TAKE  WITH  OR  IMMEDIATELY  FOLLOWING  A  MEAL 09/24/16   Chrismon, Jodell Cipro, PA  vitamin B-12 (CYANOCOBALAMIN) 1000 MCG tablet Take 1,000 mcg by mouth daily.    [provider]  vitamin C (ASCORBIC ACID) 500 MG tablet Take by mouth.    [provider]  vitamin E 400 UNIT capsule Take 400 Units by mouth daily.    [provider]    Allergies Penicillins and Streptomycin  Family History  Problem Relation Age of Onset  . Heart disease Mother   .  Arthritis Mother   . Heart disease Father   . Stroke Father     Social History Social History  Substance Use Topics  . Smoking status: Never Smoker  . Smokeless tobacco: Never Used  . Alcohol use No    Review of Systems  Constitutional: No fever. Eyes: No visual changes. ENT: No sore throat. Cardiovascular: Denies chest pain. Respiratory: Denies shortness of breath. Gastrointestinal: No nausea, no vomiting.  No diarrhea.   Genitourinary: Negative for dysuria. Positive for frequency.  Musculoskeletal: Negative for back pain. Skin: Negative for rash. Neurological: Negative for headaches, focal weakness or numbness.   ____________________________________________   PHYSICAL EXAM:  VITAL SIGNS: ED Triage Vitals [03/24/17 1932]  Enc Vitals Group     BP (!) 157/79     Pulse Rate 70     Resp 17     Temp 98 F (36.7 C)     Temp Source Oral     SpO2 96 %     Weight 116 lb (52.6 kg)     Height      Head Circumference      Peak Flow      Pain Score      Pain Loc      Pain Edu?      Excl. in GC?     Constitutional: Alert and oriented. Comfortable appearing and in no acute distress. Eyes: Conjunctivae are normal. EOMI.  PERRLA.  Head: Atraumatic. Nose: No congestion/rhinnorhea. Mouth/Throat: Mucous membranes are moist.   Neck: Normal range of motion.  Cardiovascular: Normal rate, regular rhythm. Grossly normal heart sounds.  Good peripheral circulation. Respiratory: Normal respiratory effort.  No retractions. Lungs CTAB. Gastrointestinal: Soft and nontender. No distention.  Genitourinary: No CVA tenderness. Musculoskeletal: No lower extremity edema.  Extremities warm and well perfused.  Neurologic:  Normal speech and language. No gross focal neurologic deficits are appreciated. Motor intact in all extremities.  Mild generalized ridgity.   Skin:  Skin is warm and dry. No rash noted. Psychiatric: Mood and affect are normal. Speech and behavior are normal.  ____________________________________________   LABS (all labs ordered are listed, but only abnormal results are displayed)  Labs Reviewed  BASIC METABOLIC PANEL - Abnormal; Notable for the following:       Result Value   Glucose, Bld 118 (*)    All other components within normal limits  URINALYSIS, COMPLETE (UACMP) WITH MICROSCOPIC - Abnormal; Notable for the following:    Color, Urine STRAW (*)    APPearance CLEAR (*)    Hgb urine  dipstick MODERATE (*)    Leukocytes, UA SMALL (*)    Bacteria, UA RARE (*)    Squamous Epithelial / LPF 0-5 (*)    Non Squamous Epithelial 0-5 (*)    All other components within normal limits  CBC  TROPONIN I   ____________________________________________  EKG  ED ECG REPORT I, Dionne BucySebastian Tationna Fullard, the attending physician, personally viewed and interpreted this ECG.  Date: 03/24/2017 EKG Time: 1927 Rate: 68 Rhythm: normal sinus rhythm QRS Axis: normal Intervals: normal ST/T Wave abnormalities: normal Narrative Interpretation: no evidence of acute ischemia; no significant change when compared to EKG of 02/03/2016  ____________________________________________  RADIOLOGY  CXR: no focal infiltrate or pulmonary vascular congestion  ____________________________________________   PROCEDURES  Procedure(s) performed: No    Critical Care performed: No ____________________________________________   INITIAL IMPRESSION / ASSESSMENT AND PLAN / ED COURSE  Pertinent labs & imaging results that were available during my care of the patient were  reviewed by me and considered in my medical decision making (see chart for details).  71 year old female with history of Parkinson's disease and other past medical history as noted presents with hypertension measured today at home, and associated with a chronic recurrent feeling of generalized unwellness with possible dizziness. Only other significant symptom on ROS is some urinary frequency, although patient is not sure if this is new. In ED, patient is relatively comfortable appearing, and the blood pressure is significantly improved without intervention. Other vital signs are normal. Exam is otherwise unremarkable. Review of past medical records in Epic is noncontributory.   In terms of the blood pressure, differential includes uncomplicated hypertension versus hypertensive emergency with end organ dysfunction. There are no symptoms to  suggest specific end organ dysfunction, and the EKG is normal. Blood pressure is stabilized without intervention. We will obtain basic labs and troponin to rule out acute complications. Patient's other generalized symptoms are nonspecific and chronic, and there is no evidence of acute neurologic cause. Suspect most likely symptoms related to her chronic Parkinson's, versus medication side effects. Will obtain UA and chest x-ray to rule out acute infection.    ----------------------------------------- 11:30 PM on 03/24/2017 -----------------------------------------  UA consistent with UTI. Remainder of lab workup is unremarkable. Patient's blood pressure remained stable.  She feels well to go home. Will discharge with antibiotics and instructions to follow-up with PMD. Return precautions given.  ____________________________________________   FINAL CLINICAL IMPRESSION(S) / ED DIAGNOSES  Final diagnoses:  Urinary tract infection without hematuria, site unspecified  Hypertension, unspecified type      NEW MEDICATIONS STARTED DURING THIS VISIT:  Discharge Medication List as of 03/24/2017 10:41 PM    START taking these medications   Details  cephALEXin (KEFLEX) 500 MG capsule Take 1 capsule (500 mg total) by mouth 2 (two) times daily., Starting Tue 03/25/2017, Until Fri 04/04/2017, Print         Note:  This document was prepared using Dragon voice recognition software and may include unintentional dictation errors.    Dionne Bucy, MD 03/24/17 [id number]

## 2017-03-24 NOTE — ED Triage Notes (Signed)
Pt c/o she has had episodes of dizziness and weakness since this evening. [facility] hypertension of 207/100 per pt.  Pt taken to room 3 for further evaluation.

## 2017-03-24 NOTE — ED Notes (Signed)
ED Provider at bedside. 

## 2017-03-24 NOTE — ED Notes (Signed)
Pt up to room commode to void, urine sample obtained as ordered; pt ambulatory without difficulty; no c/o at present

## 2017-03-24 NOTE — Discharge Instructions (Signed)
Take the antibiotic as prescribed starting tomorrow and finish the full course.  Return to the ER for new or worsening weakness, dizziness, very elevated blood pressure (over 180/100), severe headache, chest pain, fevers, or any other new or worsening symptoms that concern you.

## 2017-03-24 NOTE — ED Notes (Signed)
Pt uprite on stretcher in exam room with no distress noted; husband at bedside; pt reports that her parkinson symptoms have been bothering her; when asked to clarify, pt st that she doesn't feel like herself; denies pain, denies any recent illness, denies any intake or output c/o, denies any med changes, denies any falls; st that she had called her PCP regarding such and he recommended that she come to ED to be evaluated further

## 2017-04-25 ENCOUNTER — Other Ambulatory Visit: Payer: Self-pay | Admitting: Family Medicine

## 2017-04-25 DIAGNOSIS — I10 Essential (primary) hypertension: Secondary | ICD-10-CM

## 2017-05-21 ENCOUNTER — Ambulatory Visit: Payer: Medicare HMO | Admitting: Physician Assistant

## 2017-05-21 ENCOUNTER — Encounter: Payer: Self-pay | Admitting: Physician Assistant

## 2017-05-21 VITALS — BP 146/92 | HR 60 | Temp 97.4°F | Resp 16 | Wt 113.0 lb

## 2017-05-21 DIAGNOSIS — J029 Acute pharyngitis, unspecified: Secondary | ICD-10-CM | POA: Diagnosis not present

## 2017-05-21 DIAGNOSIS — R05 Cough: Secondary | ICD-10-CM | POA: Diagnosis not present

## 2017-05-21 DIAGNOSIS — R059 Cough, unspecified: Secondary | ICD-10-CM

## 2017-05-21 NOTE — Patient Instructions (Signed)
May use flonase for nasal congestion. May use mucinex and delsym to break mucous up and for cough. Tylenol for sore throat.   Upper Respiratory Infection, Adult Most upper respiratory infections (URIs) are caused by a virus. A URI affects the nose, throat, and upper air passages. The most common type of URI is often called "the common cold." Follow these instructions at home:  Take medicines only as told by your doctor.  Gargle warm saltwater or take cough drops to comfort your throat as told by your doctor.  Use a warm mist humidifier or inhale steam from a shower to increase air moisture. This may make it easier to breathe.  Drink enough fluid to keep your pee (urine) clear or pale yellow.  Eat soups and other clear broths.  Have a healthy diet.  Rest as needed.  Go back to work when your fever is gone or your doctor says it is okay. ? You may need to stay home longer to avoid giving your URI to others. ? You can also wear a face mask and wash your hands often to prevent spread of the virus.  Use your inhaler more if you have asthma.  Do not use any tobacco products, including cigarettes, chewing tobacco, or electronic cigarettes. If you need help quitting, ask your doctor. Contact a doctor if:  You are getting worse, not better.  Your symptoms are not helped by medicine.  You have chills.  You are getting more short of breath.  You have brown or red mucus.  You have yellow or brown discharge from your nose.  You have pain in your face, especially when you bend forward.  You have a fever.  You have puffy (swollen) neck glands.  You have pain while swallowing.  You have white areas in the back of your throat. Get help right away if:  You have very bad or constant: ? Headache. ? Ear pain. ? Pain in your forehead, behind your eyes, and over your cheekbones (sinus pain). ? Chest pain.  You have long-lasting (chronic) lung disease and any of the  following: ? Wheezing. ? Long-lasting cough. ? Coughing up blood. ? A change in your usual mucus.  You have a stiff neck.  You have changes in your: ? Vision. ? Hearing. ? Thinking. ? Mood. This information is not intended to replace advice given to you by your health care provider. Make sure you discuss any questions you have with your health care provider. Document Released: 11/06/2007 Document Revised: 01/21/2016 Document Reviewed: 08/25/2013 Elsevier Interactive Patient Education  2018 [employer]

## 2017-05-21 NOTE — Progress Notes (Signed)
[facility]  Chief Complaint  Patient presents with  . URI    Started yesterday    Subjective:    Patient ID: Destiny Torres, female    DOB: 12/20/1945, 71 y.o.   MRN: [id number]  Upper Respiratory Infection: Destiny Torres is a 71 y.o. female with a past medical history significant for Parkinson's on Sinemet. Symptoms include congestion, cough and sore throat. Onset of symptoms was 1 day ago, gradually worsening since that time. She also c/o congestion, cough described as productive, lightheadedness, post nasal drip, sneezing and sore throat for the past 1 day .  She is drinking plenty of fluids. Evaluation to date: none. Treatment to date: none. The treatment has provided no relief.   Review of Systems  Constitutional: Negative for activity change, appetite change, chills, diaphoresis, fatigue, fever and unexpected weight change.  HENT: Positive for congestion, postnasal drip, rhinorrhea, sore throat and tinnitus. Negative for ear discharge, ear pain, nosebleeds, sinus pressure, sinus pain, sneezing, trouble swallowing and voice change.   Eyes: Positive for discharge. Negative for photophobia, pain, redness, itching and visual disturbance.  Respiratory: Positive for cough, chest tightness, shortness of breath and wheezing. Negative for apnea, choking and stridor.   Gastrointestinal: Negative for abdominal distention, abdominal pain, anal bleeding, blood in stool, constipation, diarrhea, nausea, rectal pain and vomiting.  Musculoskeletal: Positive for neck pain and neck stiffness.  Neurological: Positive for dizziness. Negative for light-headedness and headaches.       Objective:   BP (!) 146/92 (BP Location: Left Arm, Patient Position: Sitting, Cuff Size: Normal)   Pulse 60   Temp (!) 97.4 F (36.3 C) (Oral)   Resp 16   Wt 113 lb (51.3 kg)   SpO2 98%   BMI 20.02 kg/m   Patient Active Problem List   Diagnosis Date Noted  . Parkinson's  disease (HCC) 06/04/2016  . Abdominal pain 02/07/2016  . Cholelithiasis 01/30/2016  . Palpitations 01/24/2016  . Shortness of breath 01/24/2016  . Epigastric pain   . SBO (small bowel obstruction) (HCC) 01/13/2016  . Tremor of both hands 10/03/2015  . Arthritis 01/02/2015  . Acid reflux 01/02/2015  . Benign hypertension 01/02/2015  . Dizziness 01/02/2015  . Essential (primary) hypertension 01/02/2015  . Gastro-esophageal reflux disease without esophagitis 01/02/2015  . Lax, ligament 11/09/2014    Outpatient Encounter Medications as of 05/21/2017  Medication Sig Note  . aspirin 81 MG EC tablet Take 1 tablet (81 mg total) by mouth daily. Swallow whole.   . cholecalciferol (VITAMIN D) 1000 units tablet Take 1,000 Units by mouth daily.   . famotidine (PEPCID) 40 MG tablet Take 40 mg by mouth daily.   . Magnesium 500 MG CAPS Take by mouth.   . metoprolol succinate (TOPROL-XL) 50 MG 24 hr tablet TAKE 1 TABLET BY MOUTH ONCE DAILY WITH  OR  IMMEDIATELY  FOLLOWING  A  MEAL   . vitamin B-12 (CYANOCOBALAMIN) 1000 MCG tablet Take 1,000 mcg by mouth daily.   . vitamin C (ASCORBIC ACID) 500 MG tablet Take by mouth.   . vitamin E 400 UNIT capsule Take 400 Units by mouth daily.   . carbidopa-levodopa (SINEMET IR) 25-250 MG tablet Take 1 tablet by mouth 4 (four) times daily.  [facility] Re[facility] Sig: Take 1 tablet by mouth 3 (three) times daily.   No facility-administered encounter medications on file as of 05/21/2017.     Allergies  Allergen Reactions  . Penicillins  patient unsure of reaction  . Streptomycin Rash       Physical Exam  Constitutional: She appears well-developed and well-nourished.  HENT:  Right Ear: External ear normal.  Left Ear: External ear normal.  Nose: Mucosal edema and rhinorrhea present.  Mouth/Throat: Oropharynx is clear and moist. No oropharyngeal exudate.  Clear rhinorrhea  Eyes: Right eye exhibits discharge.  Left eye exhibits discharge.  Clear discharge from eyes bilaterally.  Neck: Neck supple.  Cardiovascular: Normal rate and regular rhythm.  Pulmonary/Chest: Effort normal and breath sounds normal. No respiratory distress. She has no wheezes. She has no rales.  Lymphadenopathy:    She has no cervical adenopathy.  Skin: Skin is warm and dry.  Psychiatric: She has a normal mood and affect. Her behavior is normal.       Assessment & Plan:   1. Cough  Rapid flu negative. Exam benign, no adverse lung sounds. Patient afebrile in office. Consistent w/ viral syndrome. Counseled on sx treatment with Mucinex, delsym, and flonase. No decongestants like Sudafed or phenylephrine.  - POCT Influenza A/B  2. Sore throat  - POCT Influenza A/B  Return if symptoms worsen or fail to improve.   The entirety of the information documented in the History of Present Illness, Review of Systems and Physical Exam were personally obtained by me. Portions of this information were initially documented by Kavin LeechLaura Walsh, CMA and reviewed by me for thoroughness and accuracy.

## 2017-05-22 LAB — POCT INFLUENZA A/B
Influenza A, POC: NEGATIVE
Influenza B, POC: NEGATIVE

## 2017-06-06 DIAGNOSIS — R4189 Other symptoms and signs involving cognitive functions and awareness: Secondary | ICD-10-CM | POA: Diagnosis not present

## 2017-06-06 DIAGNOSIS — G20 Parkinson's disease: Secondary | ICD-10-CM | POA: Diagnosis not present

## 2017-06-06 DIAGNOSIS — H9312 Tinnitus, left ear: Secondary | ICD-10-CM | POA: Diagnosis not present

## 2017-08-04 DIAGNOSIS — R0602 Shortness of breath: Secondary | ICD-10-CM | POA: Diagnosis not present

## 2017-08-04 DIAGNOSIS — G20 Parkinson's disease: Secondary | ICD-10-CM | POA: Diagnosis not present

## 2017-08-04 DIAGNOSIS — R42 Dizziness and giddiness: Secondary | ICD-10-CM | POA: Diagnosis not present

## 2017-08-04 DIAGNOSIS — R002 Palpitations: Secondary | ICD-10-CM | POA: Diagnosis not present

## 2017-08-04 DIAGNOSIS — I10 Essential (primary) hypertension: Secondary | ICD-10-CM | POA: Diagnosis not present

## 2017-08-15 ENCOUNTER — Telehealth: Payer: Self-pay

## 2017-08-15 DIAGNOSIS — R0602 Shortness of breath: Secondary | ICD-10-CM | POA: Diagnosis not present

## 2017-08-15 NOTE — Telephone Encounter (Signed)
LMTCB and schedule AWV with [facility] after 09/03/17. -MM

## 2017-08-21 DIAGNOSIS — R002 Palpitations: Secondary | ICD-10-CM | POA: Diagnosis not present

## 2017-08-21 DIAGNOSIS — G20 Parkinson's disease: Secondary | ICD-10-CM | POA: Diagnosis not present

## 2017-08-21 DIAGNOSIS — I10 Essential (primary) hypertension: Secondary | ICD-10-CM | POA: Diagnosis not present

## 2017-08-21 DIAGNOSIS — R42 Dizziness and giddiness: Secondary | ICD-10-CM | POA: Diagnosis not present

## 2017-09-08 ENCOUNTER — Ambulatory Visit (INDEPENDENT_AMBULATORY_CARE_PROVIDER_SITE_OTHER): Payer: Medicare HMO

## 2017-09-08 VITALS — BP 136/64 | HR 68 | Temp 99.8°F | Ht 63.0 in | Wt 109.0 lb

## 2017-09-08 DIAGNOSIS — Z0000 Encounter for general adult medical examination without abnormal findings: Secondary | ICD-10-CM

## 2017-09-08 NOTE — Telephone Encounter (Signed)
Pt was scheduled for an AWV today @ 11:20 AM. Pt no showed apt. LMTCB and reschedule AWV. -MM

## 2017-09-08 NOTE — Progress Notes (Addendum)
Subjective:   Destiny Torres is a 72 y.o. female who presents for Medicare Annual (Subsequent) preventive examination.  Review of Systems:  N/A  Cardiac Risk Factors include: advanced age (>95men, >1 women);hypertension     Objective:     Vitals: BP 136/64 (BP Location: Right Arm)   Pulse 68   Temp 99.8 F (37.7 C) (Oral)   Ht 5\' 3"  (1.6 m)   Wt 109 lb (49.4 kg)   BMI 19.31 kg/m   Body mass index is 19.31 kg/m.  Advanced Directives 09/08/2017 09/03/2016 02/03/2016 01/22/2016 01/14/2016 01/03/2015  Does Patient Have a Medical Advance Directive? No No No No No No  Would patient like information on creating a medical advance directive? Yes (MAU/Ambulatory/Procedural Areas - Information given) No - Patient declined No - patient declined information No - patient declined information No - patient declined information -    Tobacco Social History   Tobacco Use  Smoking Status Never Smoker  Smokeless Tobacco Never Used     Counseling given: Not Answered   Clinical Intake:  Pre-visit preparation completed: Yes  Pain : No/denies pain Pain Score: 0-No pain     Nutritional Status: BMI of 19-24  Normal Nutritional Risks: None Diabetes: No  How often do you need to have someone help you when you read instructions, pamphlets, or other written materials from your doctor or pharmacy?: 1 - Never  Interpreter Needed?: No  Information entered by :: [facility], LPN  Past Medical History:  Diagnosis Date  . Arthritis   . GERD (gastroesophageal reflux disease)   . Hypertension    Past Surgical History:  Procedure Laterality Date  . HAND SURGERY Right 04/2014  . TONSILLECTOMY     Family History  Problem Relation Age of Onset  . Heart disease Mother   . Arthritis Mother   . Heart disease Father   . Stroke Father    Social History   Socioeconomic History  . Marital status: Married    Spouse name: Not on file  . Number of children: 1  . Years of education: Not on file    . Highest education level: Associate degree: occupational, Scientist, product/process development, or vocational program  Occupational History  . Occupation: retired  Engineer, production  . Financial resource strain: Not hard at all  . Food insecurity:    Worry: Never true    Inability: Never true  . Transportation needs:    Medical: No    Non-medical: No  Tobacco Use  . Smoking status: Never Smoker  . Smokeless tobacco: Never Used  Substance and Sexual Activity  . Alcohol use: No    Alcohol/week: 0.0 oz  . Drug use: No  . Sexual activity: Never  Lifestyle  . Physical activity:    Days per week: Not on file    Minutes per session: Not on file  . Stress: Not at all  Relationships  . Social connections:    Talks on phone: Not on file    Gets together: Not on file    Attends religious service: Not on file    Active member of club or organization: Not on file    Attends meetings of clubs or organizations: Not on file    Relationship status: Not on file  Other Topics Concern  . Not on file  Social History Narrative  . Not on file    Outpatient Encounter Medications as of 09/08/2017  Medication Sig  . carbidopa-levodopa (SINEMET IR) 25-250 MG tablet Take 1 tablet by  mouth 6 (six) times daily.   . cholecalciferol (VITAMIN D) 1000 units tablet Take 1,000 Units by mouth daily.  . Magnesium 500 MG CAPS Take 500 mg by mouth daily.   . Melatonin 5 MG CAPS Take by mouth as needed.  . metoprolol succinate (TOPROL-XL) 50 MG 24 hr tablet TAKE 1 TABLET BY MOUTH ONCE DAILY WITH  OR  IMMEDIATELY  FOLLOWING  A  MEAL  . vitamin B-12 (CYANOCOBALAMIN) 1000 MCG tablet Take 1,000 mcg by mouth daily.  . vitamin C (ASCORBIC ACID) 500 MG tablet Take 500 mg by mouth daily.   . vitamin E 400 UNIT capsule Take 400 Units by mouth daily.   Marland Kitchen. aspirin 81 MG EC tablet Take 1 tablet (81 mg total) by mouth daily. Swallow whole. (Patient not taking: Reported on 09/08/2017)  . famotidine (PEPCID) 40 MG tablet Take 40 mg by mouth daily.   No  facility-administered encounter medications on file as of 09/08/2017.     Activities of Daily Living In your present state of health, do you have any difficulty performing the following activities: 09/08/2017  Hearing? Y  Comment Declines use of hearing aids.   Vision? N  Difficulty concentrating or making decisions? Y  Walking or climbing stairs? Y  Comment Due to pain and stiffness in legs.   Dressing or bathing? N  Doing errands, shopping? N  Preparing Food and eating ? N  Using the Toilet? N  In the past six months, have you accidently leaked urine? Y  Comment Occasionally, while sleeping. Does not wear protection.   Do you have problems with loss of bowel control? N  Managing your Medications? N  Managing your Finances? N  Housekeeping or managing your Housekeeping? N  Some recent data might be hidden    Patient Care Team: Chrismon, Jodell Ciproennis E, PA as PCP - General (Physician Assistant) Lonell FaceShah, Hemang K, MD as Consulting Physician (Neurology) Lamar BlinksKowalski, Bruce J, MD as Consulting Physician (Cardiology) Jimmye Normanuma, Elizabeth Achieng, NP as Nurse Practitioner (Neurology)    Assessment:   This is a routine wellness examination for Destiny Torres.  Exercise Activities and Dietary recommendations Current Exercise Habits: Home exercise routine, Type of exercise: walking, Time (Minutes): > 60, Frequency (Times/Week): 1, Weekly Exercise (Minutes/Week): 0, Intensity: Mild, Exercise limited by: Other - see comments(Parkinsons Disease)  Goals    . Exercise 3x per week (30 min per time)     Recommend to start walking 3 days a week for 30 minutes at a time.        Fall Risk Fall Risk  09/08/2017 09/03/2016  Falls in the past year? Yes Yes  Number falls in past yr: 2 or more 2 or more  Injury with Fall? No Yes  Comment - open wound on head, needed stitches  Risk for fall due to : Other (Comment) Other (Comment)  Risk for fall due to: Comment Parkinsons parkinsons  Follow up Falls prevention discussed  Falls prevention discussed   Is the patient's home free of loose throw rugs in walkways, pet beds, electrical cords, etc?   yes      Grab bars in the bathroom? yes      Handrails on the stairs?   yes      Adequate lighting?   yes  Timed Get Up and Go performed: N/A  Depression Screen PHQ 2/9 Scores 09/08/2017 09/03/2016  PHQ - 2 Score 5 2  PHQ- 9 Score 12 5     Cognitive Function: Pt declined screening today.  6CIT Screen 09/08/2017 09/03/2016  What Year? 0 points 0 points  What month? 0 points 0 points  What time? 0 points 0 points  Count back from 20 0 points 0 points  Months in reverse - 0 points  Repeat phrase 10 points 4 points  Total Score - 4     There is no immunization history on file for this patient.  Qualifies for Shingles Vaccine? Due for Shingles vaccine. Declined my offer to administer today. Education has been provided regarding the importance of this vaccine. Pt has been advised to call her insurance company to determine her out of pocket expense. Advised she may also receive this vaccine at her local pharmacy or Health Dept. Verbalized acceptance and understanding.  Screening Tests Health Maintenance  Topic Date Due  . MAMMOGRAM  06/03/2026 (Originally 12/01/1993)  . COLONOSCOPY  06/03/2026 (Originally 12/01/1993)  . TETANUS/TDAP  06/03/2026 (Originally 12/02/1962)  . PNA vac Low Risk Adult (1 of 2 - PCV13) 06/03/2026 (Originally 12/01/2008)  . INFLUENZA VACCINE  01/01/2018  . DEXA SCAN  Completed  . Hepatitis C Screening  Completed    Cancer Screenings: Lung: Low Dose CT Chest recommended if Age 42-80 years, 30 pack-year currently smoking OR have quit w/in 15years. Patient does not qualify. Breast:  Up to date on Mammogram? No, pt declines setting this up.  Up to date of Bone Density/Dexa? Yes Colorectal: Pt declines referral today.   Additional Screenings:  Hepatitis C Screening: Up to date     Plan:  I have personally reviewed and addressed the  Medicare Annual Wellness questionnaire and have noted the following in the patient's chart:  A. Medical and social history B. Use of alcohol, tobacco or illicit drugs  C. Current medications and supplements D. Functional ability and status E.  Nutritional status F.  Physical activity G. Advance directives H. List of other physicians I.  Hospitalizations, surgeries, and ER visits in previous 12 months J.  Vitals K. Screenings such as hearing and vision if needed, cognitive and depression L. Referrals and appointments - none  In addition, I have reviewed and discussed with patient certain preventive protocols, quality metrics, and best practice recommendations. A written personalized care plan for preventive services as well as general preventive health recommendations were provided to patient.  See attached scanned questionnaire for additional information.   Signed,  Hyacinth Meeker, LPN Nurse Health Advisor   Nurse Recommendations: Pt declined the colonoscopy referral, setting up a mammogram, tetanus and pneumonia vaccines today.   Reviewed the documentation and recommendations of the Nurse Health Advisor. Was available to consultation and agree with note and plan.

## 2017-09-08 NOTE — Patient Instructions (Signed)
[name] , Thank you for taking time to come for your Medicare Wellness Visit. I appreciate your ongoing commitment to your health goals. Please review the following plan we discussed and let me know if I can assist you in the future.   Screening recommendations/referrals: Colonoscopy: Pt declines today.  Mammogram: Pt declines today.  Bone Density: Up to date Recommended yearly ophthalmology/optometry visit for glaucoma screening and checkup Recommended yearly dental visit for hygiene and checkup  Vaccinations: Influenza vaccine: N/A Pneumococcal vaccine: Pt declines today.  Tdap vaccine: Pt declines today.  Shingles vaccine: Pt declines today.     Advanced directives: Advance directive discussed with you today. Even though you declined this today please call our office should you change your mind and we can give you the proper paperwork for you to fill out.  Conditions/risks identified: Recommend to start walking 3 days a week for 30 minutes at a time.   Next appointment: Advised pt to schedule CPE with Maurine Ministerennis in the next 1-2 months. Pt scheduled AWV for 09/11/18.   Preventive Care 765 Years and Older, Female Preventive care refers to lifestyle choices and visits with your health care provider that can promote health and wellness. What does preventive care include?  A yearly physical exam. This is also called an annual well check.  Dental exams once or twice a year.  Routine eye exams. Ask your health care provider how often you should have your eyes checked.  Personal lifestyle choices, including:  Daily care of your teeth and gums.  Regular physical activity.  Eating a healthy diet.  Avoiding tobacco and drug use.  Limiting alcohol use.  Practicing safe sex.  Taking low-dose aspirin every day.  Taking vitamin and mineral supplements as recommended by your health care provider. What happens during an annual well check? The services and screenings done by your  health care provider during your annual well check will depend on your age, overall health, lifestyle risk factors, and family history of disease. Counseling  Your health care provider may ask you questions about your:  Alcohol use.  Tobacco use.  Drug use.  Emotional well-being.  Home and relationship well-being.  Sexual activity.  Eating habits.  History of falls.  Memory and ability to understand (cognition).  Work and work Astronomerenvironment.  Reproductive health. Screening  You may have the following tests or measurements:  Height, weight, and BMI.  Blood pressure.  Lipid and cholesterol levels. These may be checked every 5 years, or more frequently if you are over 74 years old.  Skin check.  Lung cancer screening. You may have this screening every year starting at age 74 if you have a 30-pack-year history of smoking and currently smoke or have quit within the past 15 years.  Fecal occult blood test (FOBT) of the stool. You may have this test every year starting at age 74.  Flexible sigmoidoscopy or colonoscopy. You may have a sigmoidoscopy every 5 years or a colonoscopy every 10 years starting at age 74.  Hepatitis C blood test.  Hepatitis B blood test.  Sexually transmitted disease (STD) testing.  Diabetes screening. This is done by checking your blood sugar (glucose) after you have not eaten for a while (fasting). You may have this done every 1-3 years.  Bone density scan. This is done to screen for osteoporosis. You may have this done starting at age 74.  Mammogram. This may be done every 1-2 years. Talk to your health care provider about how often  you should have regular mammograms. Talk with your health care provider about your test results, treatment options, and if necessary, the need for more tests. Vaccines  Your health care provider may recommend certain vaccines, such as:  Influenza vaccine. This is recommended every year.  Tetanus, diphtheria, and  acellular pertussis (Tdap, Td) vaccine. You may need a Td booster every 10 years.  Zoster vaccine. You may need this after age 81.  Pneumococcal 13-valent conjugate (PCV13) vaccine. One dose is recommended after age 37.  Pneumococcal polysaccharide (PPSV23) vaccine. One dose is recommended after age 49. Talk to your health care provider about which screenings and vaccines you need and how often you need them. This information is not intended to replace advice given to you by your health care provider. Make sure you discuss any questions you have with your health care provider. Document Released: 06/16/2015 Document Revised: 02/07/2016 Document Reviewed: 03/21/2015 Elsevier Interactive Patient Education  2017 [employer] Prevention in the Home Falls can cause injuries. They can happen to people of all ages. There are many things you can do to make your home safe and to help prevent falls. What can I do on the outside of my home?  Regularly fix the edges of walkways and driveways and fix any cracks.  Remove anything that might make you trip as you walk through a door, such as a raised step or threshold.  Trim any bushes or trees on the path to your home.  Use bright outdoor lighting.  Clear any walking paths of anything that might make someone trip, such as rocks or tools.  Regularly check to see if handrails are loose or broken. Make sure that both sides of any steps have handrails.  Any raised decks and porches should have guardrails on the edges.  Have any leaves, snow, or ice cleared regularly.  Use sand or salt on walking paths during winter.  Clean up any spills in your garage right away. This includes oil or grease spills. What can I do in the bathroom?  Use night lights.  Install grab bars by the toilet and in the tub and shower. Do not use towel bars as grab bars.  Use non-skid mats or decals in the tub or shower.  If you need to sit down in the shower, use  a plastic, non-slip stool.  Keep the floor dry. Clean up any water that spills on the floor as soon as it happens.  Remove soap buildup in the tub or shower regularly.  Attach bath mats securely with double-sided non-slip rug tape.  Do not have throw rugs and other things on the floor that can make you trip. What can I do in the bedroom?  Use night lights.  Make sure that you have a light by your bed that is easy to reach.  Do not use any sheets or blankets that are too big for your bed. They should not hang down onto the floor.  Have a firm chair that has side arms. You can use this for support while you get dressed.  Do not have throw rugs and other things on the floor that can make you trip. What can I do in the kitchen?  Clean up any spills right away.  Avoid walking on wet floors.  Keep items that you use a lot in easy-to-reach places.  If you need to reach something above you, use a strong step stool that has a grab bar.  Keep electrical cords  out of the way.  Do not use floor polish or wax that makes floors slippery. If you must use wax, use non-skid floor wax.  Do not have throw rugs and other things on the floor that can make you trip. What can I do with my stairs?  Do not leave any items on the stairs.  Make sure that there are handrails on both sides of the stairs and use them. Fix handrails that are broken or loose. Make sure that handrails are as long as the stairways.  Check any carpeting to make sure that it is firmly attached to the stairs. Fix any carpet that is loose or worn.  Avoid having throw rugs at the top or bottom of the stairs. If you do have throw rugs, attach them to the floor with carpet tape.  Make sure that you have a light switch at the top of the stairs and the bottom of the stairs. If you do not have them, ask someone to add them for you. What else can I do to help prevent falls?  Wear shoes that:  Do not have high heels.  Have  rubber bottoms.  Are comfortable and fit you well.  Are closed at the toe. Do not wear sandals.  If you use a stepladder:  Make sure that it is fully opened. Do not climb a closed stepladder.  Make sure that both sides of the stepladder are locked into place.  Ask someone to hold it for you, if possible.  Clearly mark and make sure that you can see:  Any grab bars or handrails.  First and last steps.  Where the edge of each step is.  Use tools that help you move around (mobility aids) if they are needed. These include:  Canes.  Walkers.  Scooters.  Crutches.  Turn on the lights when you go into a dark area. Replace any light bulbs as soon as they burn out.  Set up your furniture so you have a clear path. Avoid moving your furniture around.  If any of your floors are uneven, fix them.  If there are any pets around you, be aware of where they are.  Review your medicines with your doctor. Some medicines can make you feel dizzy. This can increase your chance of falling. Ask your doctor what other things that you can do to help prevent falls. This information is not intended to replace advice given to you by your health care provider. Make sure you discuss any questions you have with your health care provider. Document Released: 03/16/2009 Document Revised: 10/26/2015 Document Reviewed: 06/24/2014 Elsevier Interactive Patient Education  2017 [employer].

## 2017-09-15 ENCOUNTER — Encounter: Payer: Self-pay | Admitting: Family Medicine

## 2017-09-15 ENCOUNTER — Ambulatory Visit (INDEPENDENT_AMBULATORY_CARE_PROVIDER_SITE_OTHER): Payer: Medicare HMO | Admitting: Family Medicine

## 2017-09-15 VITALS — BP 142/64 | HR 59 | Temp 98.4°F | Ht 63.0 in | Wt 110.2 lb

## 2017-09-15 DIAGNOSIS — M1990 Unspecified osteoarthritis, unspecified site: Secondary | ICD-10-CM

## 2017-09-15 DIAGNOSIS — I10 Essential (primary) hypertension: Secondary | ICD-10-CM | POA: Diagnosis not present

## 2017-09-15 DIAGNOSIS — G20 Parkinson's disease: Secondary | ICD-10-CM

## 2017-09-15 DIAGNOSIS — G20A1 Parkinson's disease without dyskinesia, without mention of fluctuations: Secondary | ICD-10-CM

## 2017-09-15 NOTE — Progress Notes (Signed)
Patient: Destiny Torres, Female    DOB: 1946-02-18, 72 y.o.   MRN: [id number] Visit Date: 09/15/2017  Today's Provider: Dortha Kern, PA   Chief Complaint  Patient presents with  . Annual Exam   Subjective:     Complete Physical Destiny Torres is a 72 y.o. female. She feels poorly. She reports exercising none. She reports she is sleeping poorly.  -----------------------------------------------------------   Review of Systems  Constitutional: Positive for activity change, appetite change and fatigue.  HENT: Positive for congestion and voice change.   Eyes: Negative.   Respiratory: Positive for chest tightness and shortness of breath.   Cardiovascular: Positive for chest pain and palpitations.  Gastrointestinal: Negative.   Endocrine: Positive for cold intolerance.  Genitourinary: Negative.   Musculoskeletal: Positive for arthralgias, back pain, neck pain and neck stiffness.  Skin: Negative.   Allergic/Immunologic: Negative.   Neurological: Positive for dizziness, tremors, weakness and light-headedness.  Hematological: Negative.   Psychiatric/Behavioral: Positive for decreased concentration and sleep disturbance. The patient is nervous/anxious.     Social History   Socioeconomic History  . Marital status: Married    Spouse name: Not on file  . Number of children: 1  . Years of education: Not on file  . Highest education level: Associate degree: occupational, Scientist, product/process development, or vocational program  Occupational History  . Occupation: retired  Engineer, production  . Financial resource strain: Not hard at all  . Food insecurity:    Worry: Never true    Inability: Never true  . Transportation needs:    Medical: No    Non-medical: No  Tobacco Use  . Smoking status: Never Smoker  . Smokeless tobacco: Never Used  Substance and Sexual Activity  . Alcohol use: No    Alcohol/week: 0.0 oz  . Drug use: No  . Sexual activity: Never  Lifestyle  . Physical activity:    Days  per week: Not on file    Minutes per session: Not on file  . Stress: Not at all  Relationships  . Social connections:    Talks on phone: Not on file    Gets together: Not on file    Attends religious service: Not on file    Active member of club or organization: Not on file    Attends meetings of clubs or organizations: Not on file    Relationship status: Not on file  . Intimate partner violence:    Fear of current or ex partner: Not on file    Emotionally abused: Not on file    Physically abused: Not on file    Forced sexual activity: Not on file  Other Topics Concern  . Not on file  Social History Narrative  . Not on file    Past Medical History:  Diagnosis Date  . Arthritis   . GERD (gastroesophageal reflux disease)   . Hypertension      Patient Active Problem List   Diagnosis Date Noted  . Gait instability 06/19/2016  . Parkinson's disease (HCC) 06/04/2016  . Abdominal pain 02/07/2016  . Cholelithiasis 01/30/2016  . Palpitations 01/24/2016  . Shortness of breath 01/24/2016  . Epigastric pain   . SBO (small bowel obstruction) (HCC) 01/13/2016  . Tremor of both hands 10/03/2015  . Arthritis 01/02/2015  . Acid reflux 01/02/2015  . Benign hypertension 01/02/2015  . Dizziness 01/02/2015  . Essential (primary) hypertension 01/02/2015  . Gastro-esophageal reflux disease without esophagitis 01/02/2015  . Lax, ligament 11/09/2014   Past Surgical History:  Procedure Laterality Date  . HAND SURGERY Right 04/2014  . TONSILLECTOMY     Her family history includes Arthritis in her mother; Heart disease in her father and mother; Stroke in her father.     Current Outpatient Medications:  .  cholecalciferol (VITAMIN D) 1000 units tablet, Take 1,000 Units by mouth daily., Disp: , Rfl:  .  famotidine (PEPCID) 40 MG tablet, Take 40 mg by mouth daily., Disp: , Rfl:  .  Magnesium 500 MG CAPS, Take 500 mg by mouth daily. , Disp: , Rfl:  .  Melatonin 5 MG CAPS, Take by mouth as  needed., Disp: , Rfl:  .  metoprolol succinate (TOPROL-XL) 50 MG 24 hr tablet, TAKE 1 TABLET BY MOUTH ONCE DAILY WITH  OR  IMMEDIATELY  FOLLOWING  A  MEAL, Disp: 90 tablet, Rfl: 1 .  vitamin B-12 (CYANOCOBALAMIN) 1000 MCG tablet, Take 1,000 mcg by mouth daily., Disp: , Rfl:  .  vitamin C (ASCORBIC ACID) 500 MG tablet, Take 500 mg by mouth daily. , Disp: , Rfl:  .  vitamin E 400 UNIT capsule, Take 400 Units by mouth daily. , Disp: , Rfl:  .  aspirin 81 MG EC tablet, Take 1 tablet (81 mg total) by mouth daily. Swallow whole. (Patient not taking: Reported on 09/08/2017), Disp: 30 tablet, Rfl: 12 .  b complex vitamins capsule, Take by mouth., Disp: , Rfl:  .  carbidopa-levodopa (SINEMET IR) 25-250 MG tablet, Take 1 tablet by mouth 6 (six) times daily. , Disp: , Rfl:   Patient Care Team: Brittian Renaldo, Jodell Ciproennis E, PA as PCP - General (Physician Assistant) Lonell FaceShah, Hemang K, MD as Consulting Physician (Neurology) Lamar BlinksKowalski, Bruce J, MD as Consulting Physician (Cardiology) Jimmye Normanuma, Elizabeth Achieng, NP as Nurse Practitioner (Neurology)    Objective:   Vitals: BP (!) 142/64 (BP Location: Right Arm, Patient Position: Sitting, Cuff Size: Normal)   Pulse (!) 59   Temp 98.4 F (36.9 C) (Oral)   Ht 5\' 3"  (1.6 m)   Wt 110 lb 3.2 oz (50 kg)   SpO2 98%   BMI 19.52 kg/m   Physical Exam  Constitutional: She is oriented to person, place, and time. She appears well-developed and well-nourished. No distress.  HENT:  Head: Normocephalic and atraumatic.  Right Ear: Hearing and external ear normal.  Left Ear: Hearing and external ear normal.  Nose: Nose normal.  Eyes: Conjunctivae, EOM and lids are normal. Right eye exhibits no discharge. Left eye exhibits no discharge. No scleral icterus.  Neck: Normal range of motion. Neck supple. No thyromegaly present.  Cardiovascular: Normal rate and regular rhythm.  Pulmonary/Chest: Effort normal and breath sounds normal. No respiratory distress.  Abdominal: Soft. Bowel sounds  are normal.  Musculoskeletal:  Moderate dorsal kyphosis. Enlarged MCP joints both hands with tight extensor tendons that ratchet joints when she tries to flex them. Decreased grip strength.  Neurological: She is alert and oriented to person, place, and time.  Slow gait with slight shuffling.  Skin: Skin is intact. No lesion and no rash noted.  Psychiatric: She has a normal mood and affect. Her speech is normal and behavior is normal. Thought content normal.   Activities of Daily Living In your present state of health, do you have any difficulty performing the following activities: 09/08/2017  Hearing? Y  Comment Declines use of hearing aids.   Vision? N  Difficulty concentrating or making decisions? Y  Walking or climbing stairs? Y  Comment Due to pain and stiffness in legs.  Dressing or bathing? N  Doing errands, shopping? N  Preparing Food and eating ? N  Using the Toilet? N  In the past six months, have you accidently leaked urine? Y  Comment Occasionally, while sleeping. Does not wear protection.   Do you have problems with loss of bowel control? N  Managing your Medications? N  Managing your Finances? N  Housekeeping or managing your Housekeeping? N  Some recent data might be hidden   Fall Risk Assessment Fall Risk  09/08/2017 09/03/2016  Falls in the past year? Yes Yes  Number falls in past yr: 2 or more 2 or more  Injury with Fall? No Yes  Comment - open wound on head, needed stitches  Risk for fall due to : Other (Comment) Other (Comment)  Risk for fall due to: Comment Parkinsons parkinsons  Follow up Falls prevention discussed Falls prevention discussed   Depression Screen PHQ 2/9 Scores 09/08/2017 09/03/2016  PHQ - 2 Score 5 2  PHQ- 9 Score 12 5    Assessment & Plan:    Annual Physical Reviewed patient's Family Medical History Reviewed and updated list of patient's medical providers Assessment of cognitive impairment was done Assessed patient's functional  ability Established a written schedule for health screening services Health Risk Assessent Completed and Reviewed  Exercise Activities and Dietary recommendations Goals    . Exercise 3x per week (30 min per time)     Recommend to start walking 3 days a week for 30 minutes at a time.        Health Maintenance  Topic Date Due  . MAMMOGRAM  06/03/2026 (Originally 12/01/1993)  . COLONOSCOPY  06/03/2026 (Originally 12/01/1993)  . TETANUS/TDAP  06/03/2026 (Originally 12/02/1962)  . PNA vac Low Risk Adult (1 of 2 - PCV13) 06/03/2026 (Originally 12/01/2008)  . INFLUENZA VACCINE  01/01/2018  . DEXA SCAN  Completed  . Hepatitis C Screening  Completed     Discussed health benefits of physical activity, and encouraged her to engage in regular exercise appropriate for her age and condition.    ------------------------------------------------------------------------------------------------------------ 1. Essential (primary) hypertension Stable on the Metoprolol Succinate 50 mg qd. Also, uses this beta blocker for any palpitations and tremor (Parkinson's). Had follow up with Dr. Lady Gary (cardiologist) 08-21-17. Recheck labs and follow up prn. Refuses immunizations, mammograms and colonoscopy. - CBC with Differential/Platelet - Comprehensive metabolic panel - TSH - Lipid panel  2. Parkinson's disease (HCC) Balance issues with muscle weakness and dementia/"Sundown" reactions occasionally. States neurologist (Dr. Sherryll Burger) wanted her to take a sixth tablet of the Sinemet 25-250 mg daily. Questionably has noticed hallucinations (auditory and visual). Will keep appointments for neurology follow up. Will get CBC, TSH and CMP. - CBC with Differential/Platelet - Comprehensive metabolic panel - TSH  3. Arthritis Degenerative changes in wrists and spine. Has very little grip strength and has to have help with any knobs or fasteners. May use OTC NSAID if needed. - CBC with Differential/Platelet    Dortha Kern, PA  [facility]

## 2017-09-16 ENCOUNTER — Telehealth: Payer: Self-pay | Admitting: Family Medicine

## 2017-09-16 LAB — CBC WITH DIFFERENTIAL/PLATELET
Basophils Absolute: 0 10*3/uL (ref 0.0–0.2)
Basos: 0 %
EOS (ABSOLUTE): 0.1 10*3/uL (ref 0.0–0.4)
Eos: 1 %
Hematocrit: 40 % (ref 34.0–46.6)
Hemoglobin: 13.2 g/dL (ref 11.1–15.9)
IMMATURE GRANS (ABS): 0.1 10*3/uL (ref 0.0–0.1)
Immature Granulocytes: 1 %
LYMPHS: 33 %
Lymphocytes Absolute: 2.7 10*3/uL (ref 0.7–3.1)
MCH: 29.5 pg (ref 26.6–33.0)
MCHC: 33 g/dL (ref 31.5–35.7)
MCV: 90 fL (ref 79–97)
MONOCYTES: 9 %
Monocytes Absolute: 0.7 10*3/uL (ref 0.1–0.9)
NEUTROS ABS: 4.6 10*3/uL (ref 1.4–7.0)
Neutrophils: 56 %
Platelets: 273 10*3/uL (ref 150–379)
RBC: 4.47 x10E6/uL (ref 3.77–5.28)
RDW: 14 % (ref 12.3–15.4)
WBC: 8.1 10*3/uL (ref 3.4–10.8)

## 2017-09-16 LAB — TSH: TSH: 2.67 u[IU]/mL (ref 0.450–4.500)

## 2017-09-16 LAB — COMPREHENSIVE METABOLIC PANEL
ALT: 4 IU/L (ref 0–32)
AST: 14 IU/L (ref 0–40)
Albumin/Globulin Ratio: 1.5 (ref 1.2–2.2)
Albumin: 3.6 g/dL (ref 3.5–4.8)
Alkaline Phosphatase: 99 IU/L (ref 39–117)
BUN / CREAT RATIO: 28 (ref 12–28)
BUN: 17 mg/dL (ref 8–27)
Bilirubin Total: 0.4 mg/dL (ref 0.0–1.2)
CALCIUM: 8.8 mg/dL (ref 8.7–10.3)
CO2: 24 mmol/L (ref 20–29)
CREATININE: 0.61 mg/dL (ref 0.57–1.00)
Chloride: 103 mmol/L (ref 96–106)
GFR, EST AFRICAN AMERICAN: 104 mL/min/{1.73_m2} (ref >59)
GFR, EST NON AFRICAN AMERICAN: 90 mL/min/{1.73_m2} (ref >59)
GLUCOSE: 83 mg/dL (ref 65–99)
Globulin, Total: 2.4 g/dL (ref 1.5–4.5)
Potassium: 4.3 mmol/L (ref 3.5–5.2)
Sodium: 140 mmol/L (ref 134–144)
TOTAL PROTEIN: 6 g/dL (ref 6.0–8.5)

## 2017-09-16 LAB — LIPID PANEL
CHOL/HDL RATIO: 3.5 ratio (ref 0.0–4.4)
Cholesterol, Total: 181 mg/dL (ref 100–199)
HDL: 51 mg/dL (ref >39)
LDL Calculated: 83 mg/dL (ref 0–99)
Triglycerides: 233 mg/dL — ABNORMAL HIGH (ref 0–149)
VLDL CHOLESTEROL CAL: 47 mg/dL — AB (ref 5–40)

## 2017-09-16 NOTE — Telephone Encounter (Signed)
Left message to call back  

## 2017-09-16 NOTE — Telephone Encounter (Signed)
Pt called saying someone called her from our office but did not leave a message...  Destiny Torres

## 2017-09-16 NOTE — Telephone Encounter (Signed)
Advised patient of results.  

## 2017-09-16 NOTE — Telephone Encounter (Signed)
-----   Message from Tamsen Roersennis E Chrismon, [place] sent at 09/16/2017  9:33 AM EDT ----- All blood tests normal except triglycerides elevated. Recommend Krill Oil qd and keep follow up appointment with neurologist office for monitoring the Sinemet dosage for the Parkinson's disease.

## 2017-10-08 DIAGNOSIS — G4752 REM sleep behavior disorder: Secondary | ICD-10-CM | POA: Diagnosis not present

## 2017-10-08 DIAGNOSIS — G20 Parkinson's disease: Secondary | ICD-10-CM | POA: Diagnosis not present

## 2017-10-08 DIAGNOSIS — R69 Illness, unspecified: Secondary | ICD-10-CM | POA: Diagnosis not present

## 2017-10-08 DIAGNOSIS — R2681 Unsteadiness on feet: Secondary | ICD-10-CM | POA: Diagnosis not present

## 2017-10-18 ENCOUNTER — Other Ambulatory Visit: Payer: Self-pay | Admitting: Family Medicine

## 2017-10-18 DIAGNOSIS — I10 Essential (primary) hypertension: Secondary | ICD-10-CM

## 2017-10-20 ENCOUNTER — Telehealth: Payer: Self-pay | Admitting: Family Medicine

## 2017-10-20 NOTE — Telephone Encounter (Signed)
Pt needs new refill on her metoproplol   [facility] [address]  [place]

## 2017-10-20 NOTE — Telephone Encounter (Signed)
See message below. KW 

## 2017-10-20 NOTE — Telephone Encounter (Signed)
Please review. KW 

## 2017-10-21 NOTE — Telephone Encounter (Signed)
Chart indicates Metoprolol refill was received by [facility] on 10-20-17 at 9:47am.

## 2018-01-22 ENCOUNTER — Encounter: Payer: Self-pay | Admitting: *Deleted

## 2018-01-22 ENCOUNTER — Emergency Department
Admission: EM | Admit: 2018-01-22 | Discharge: 2018-01-22 | Disposition: A | Discharge status: Home / Self Care | Payer: Medicare HMO | Attending: Emergency Medicine | Admitting: Emergency Medicine

## 2018-01-22 ENCOUNTER — Emergency Department: Payer: Medicare HMO

## 2018-01-22 ENCOUNTER — Other Ambulatory Visit: Payer: Self-pay

## 2018-01-22 DIAGNOSIS — S8001XA Contusion of right knee, initial encounter: Secondary | ICD-10-CM | POA: Diagnosis not present

## 2018-01-22 DIAGNOSIS — G20 Parkinson's disease: Secondary | ICD-10-CM | POA: Insufficient documentation

## 2018-01-22 DIAGNOSIS — Y929 Unspecified place or not applicable: Secondary | ICD-10-CM | POA: Insufficient documentation

## 2018-01-22 DIAGNOSIS — W1830XA Fall on same level, unspecified, initial encounter: Secondary | ICD-10-CM | POA: Insufficient documentation

## 2018-01-22 DIAGNOSIS — S8991XA Unspecified injury of right lower leg, initial encounter: Secondary | ICD-10-CM | POA: Diagnosis not present

## 2018-01-22 DIAGNOSIS — Z79899 Other long term (current) drug therapy: Secondary | ICD-10-CM | POA: Insufficient documentation

## 2018-01-22 DIAGNOSIS — Y9301 Activity, walking, marching and hiking: Secondary | ICD-10-CM | POA: Diagnosis not present

## 2018-01-22 DIAGNOSIS — I10 Essential (primary) hypertension: Secondary | ICD-10-CM | POA: Insufficient documentation

## 2018-01-22 DIAGNOSIS — M25561 Pain in right knee: Secondary | ICD-10-CM | POA: Diagnosis not present

## 2018-01-22 DIAGNOSIS — Y999 Unspecified external cause status: Secondary | ICD-10-CM | POA: Diagnosis not present

## 2018-01-22 DIAGNOSIS — E860 Dehydration: Secondary | ICD-10-CM | POA: Diagnosis not present

## 2018-01-22 LAB — URINALYSIS, COMPLETE (UACMP) WITH MICROSCOPIC
Bacteria, UA: NONE SEEN
Bilirubin Urine: NEGATIVE
Glucose, UA: NEGATIVE mg/dL
Ketones, ur: NEGATIVE mg/dL
Leukocytes, UA: NEGATIVE
NITRITE: NEGATIVE
Protein, ur: NEGATIVE mg/dL
SPECIFIC GRAVITY, URINE: 1.005 (ref 1.005–1.030)
Squamous Epithelial / LPF: NONE SEEN (ref 0–5)
pH: 7 (ref 5.0–8.0)

## 2018-01-22 LAB — CBC WITH DIFFERENTIAL/PLATELET
BASOS PCT: 0 %
Basophils Absolute: 0 10*3/uL (ref 0–0.1)
EOS PCT: 1 %
Eosinophils Absolute: 0.1 10*3/uL (ref 0–0.7)
HCT: 42.6 % (ref 35.0–47.0)
Hemoglobin: 14.6 g/dL (ref 12.0–16.0)
LYMPHS ABS: 2.3 10*3/uL (ref 1.0–3.6)
Lymphocytes Relative: 26 %
MCH: 31.2 pg (ref 26.0–34.0)
MCHC: 34.3 g/dL (ref 32.0–36.0)
MCV: 91 fL (ref 80.0–100.0)
Monocytes Absolute: 0.7 10*3/uL (ref 0.2–0.9)
Monocytes Relative: 8 %
Neutro Abs: 5.7 10*3/uL (ref 1.4–6.5)
Neutrophils Relative %: 65 %
PLATELETS: 281 10*3/uL (ref 150–440)
RBC: 4.68 MIL/uL (ref 3.80–5.20)
RDW: 13.7 % (ref 11.5–14.5)
WBC: 8.8 10*3/uL (ref 3.6–11.0)

## 2018-01-22 LAB — BASIC METABOLIC PANEL
Anion gap: 5 (ref 5–15)
BUN: 19 mg/dL (ref 8–23)
CALCIUM: 9 mg/dL (ref 8.9–10.3)
CHLORIDE: 102 mmol/L (ref 98–111)
CO2: 29 mmol/L (ref 22–32)
CREATININE: 0.6 mg/dL (ref 0.44–1.00)
GFR calc non Af Amer: >60 mL/min (ref >60)
Glucose, Bld: 109 mg/dL — ABNORMAL HIGH (ref 70–99)
Potassium: 4.2 mmol/L (ref 3.5–5.1)
SODIUM: 136 mmol/L (ref 135–145)

## 2018-01-22 MED ORDER — SODIUM CHLORIDE 0.9 % IV BOLUS
1000.0000 mL | Freq: Once | INTRAVENOUS | Status: AC
  Administered 2018-01-22: 1000 mL via INTRAVENOUS

## 2018-01-22 NOTE — ED Provider Notes (Signed)
[facility] Emergency Department Provider Note ____________________________________________   First MD Initiated Contact with Patient 01/22/18 2201     (approximate)  I have reviewed the triage vital signs and the nursing notes.   HISTORY  Chief Complaint Fall    HPI Destiny Torres is a 72 y.o. female with PMH as noted below who presents with right knee injury after a fall.  The patient states that she was walking and is not completely sure why she fell.  She states that sometimes with her Parkinson's she loses her balance, and sometimes she gets brief dizzy spells.  She states that this has happened multiple times before.  She states she was in her usual state of health before today, but had some diarrhea after eating lunch with her husband.  She states she initially had some pain in her right arm but it is better now.  She no longer feels dizzy or weak.  Past Medical History:  Diagnosis Date  . Arthritis   . GERD (gastroesophageal reflux disease)   . Hypertension     Patient Active Problem List   Diagnosis Date Noted  . Gait instability 06/19/2016  . Parkinson's disease (HCC) 06/04/2016  . Abdominal pain 02/07/2016  . Cholelithiasis 01/30/2016  . Palpitations 01/24/2016  . Shortness of breath 01/24/2016  . Epigastric pain   . SBO (small bowel obstruction) (HCC) 01/13/2016  . Tremor of both hands 10/03/2015  . Arthritis 01/02/2015  . Acid reflux 01/02/2015  . Benign hypertension 01/02/2015  . Dizziness 01/02/2015  . Essential (primary) hypertension 01/02/2015  . Gastro-esophageal reflux disease without esophagitis 01/02/2015  . 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 HAND SURGERY Right 04/2014  . TONSILLECTOMY      Prior to Admission medications   Medication Sig Start Date End Date Taking? Authorizing Provider  aspirin 81 MG EC tablet Take 1 tablet (81 mg total) by mouth daily. Swallow  whole. Patient not taking: Reported on 09/08/2017 01/22/16   Emily FilbertWilliams, Jonathan E, MD  b complex vitamins capsule Take by mouth.    [provider]  carbidopa-levodopa (SINEMET IR) 25-250 MG tablet Take 1 tablet by mouth 6 (six) times daily.  05/22/16 09/08/17  [provider]  cholecalciferol (VITAMIN D) 1000 units tablet Take 1,000 Units by mouth daily.    [provider]  famotidine (PEPCID) 40 MG tablet Take 40 mg by mouth daily.    [provider]  Magnesium 500 MG CAPS Take 500 mg by mouth daily.     [provider]  Melatonin 5 MG CAPS Take by mouth as needed.    [provider]  metoprolol succinate (TOPROL-XL) 50 MG 24 hr tablet TAKE 1 TABLET BY MOUTH ONCE DAILY WITH  OR  IMMEDIATELY  FOLLOWING  A  MEAL 10/20/17   Chrismon, Jodell Ciproennis E, PA  vitamin B-12 (CYANOCOBALAMIN) 1000 MCG tablet Take 1,000 mcg by mouth daily.    [provider]  vitamin C (ASCORBIC ACID) 500 MG tablet Take 500 mg by mouth daily.     [provider]  vitamin E 400 UNIT capsule Take 400 Units by mouth daily.     [provider]    Allergies Penicillins and Streptomycin  Family History  Problem Relation Age of Onset  . Heart disease Mother   . Arthritis Mother   . Heart disease Father   . Stroke Father     Social History Social History  Tobacco Use  . Smoking status: Never Smoker  . Smokeless tobacco: Never Used  Substance Use Topics  . Alcohol use: No    Alcohol/week: 0.0 standard drinks  . Drug use: No    Review of Systems  Constitutional: No fever. Eyes: No redness. ENT: No neck pain. Cardiovascular: Denies chest pain. Respiratory: Denies shortness of breath. Gastrointestinal: Positive for resolved diarrhea.  Genitourinary: Negative for dysuria or flank pain.  Musculoskeletal: Negative for back pain.  Positive for right knee injury. Skin: Negative for rash. Neurological: Negative for  headache.   ____________________________________________   PHYSICAL EXAM:  VITAL SIGNS: ED Triage Vitals  Enc Vitals Group     BP 01/22/18 2032 (!) 182/79     Pulse Rate 01/22/18 2032 63     Resp 01/22/18 2032 20     Temp 01/22/18 2032 98.3 F (36.8 C)     Temp Source 01/22/18 2032 Oral     SpO2 01/22/18 2032 98 %     Weight 01/22/18 2034 115 lb (52.2 kg)     Height 01/22/18 2034 5' (1.524 m)     Head Circumference --      Peak Flow --      Pain Score 01/22/18 2034 5     Pain Loc --      Pain Edu? --      Excl. in GC? --     Constitutional: Alert and oriented.  Relatively well appearing and in no acute distress. Eyes: Conjunctivae are normal.  EOMI. Head: Atraumatic. Nose: No congestion/rhinnorhea. Mouth/Throat: Mucous membranes are somewhat dry.   Neck: Normal range of motion.  No midline spinal tenderness. Cardiovascular: Normal rate, regular rhythm. Grossly normal heart sounds.  Good peripheral circulation. Respiratory: Normal respiratory effort.  No retractions. Lungs CTAB.  No chest wall tenderness. Gastrointestinal: Soft and nontender. No distention.  Genitourinary: No flank tenderness. Musculoskeletal: No lower extremity edema.  Extremities warm and well perfused.  Swelling and tenderness to right knee with good ROM.  Full ROM of right hip and ankle with no deformity or bony tenderness.  Distal pulses intact. Neurologic:  Normal speech and language.  5/5 motor strength and intact sensation to all extremities.  Normal coordination.  No gross focal neurologic deficits are appreciated.  Skin:  Skin is warm and dry. No rash noted. Psychiatric: Mood and affect are normal. Speech and behavior are normal.  ____________________________________________   LABS (all labs ordered are listed, but only abnormal results are displayed)  Labs Reviewed  BASIC METABOLIC PANEL - Abnormal; Notable for the following components:      Result Value   Glucose, Bld 109 (*)    All  other components within normal limits  URINALYSIS, COMPLETE (UACMP) WITH MICROSCOPIC - Abnormal; Notable for the following components:   Color, Urine STRAW (*)    APPearance CLEAR (*)    Hgb urine dipstick MODERATE (*)    All other components within normal limits  CBC WITH DIFFERENTIAL/PLATELET   ____________________________________________  EKG  ED ECG REPORT I, Dionne Bucy, the attending physician, personally viewed and interpreted this ECG.  Date: 01/22/2018 EKG Time: 2143 Rate: 53 Rhythm: normal sinus rhythm QRS Axis: normal Intervals: normal ST/T Wave abnormalities: normal Narrative Interpretation: no evidence of acute ischemia  ____________________________________________  RADIOLOGY  XR R knee: No acute fracture  ____________________________________________   PROCEDURES  Procedure(s) performed: No  Procedures  Critical Care performed: No ____________________________________________   INITIAL IMPRESSION / ASSESSMENT AND PLAN / ED COURSE  Pertinent labs &  imaging results that were available during my care of the patient were reviewed by me and considered in my medical decision making (see chart for details).  72 year old female with a history of Parkinson's and other PMH as noted above presents with right knee injury after a fall from standing height.  The patient states that she felt like she lost her balance, and may be was slightly dizzy or lightheaded.  She had some diarrhea earlier today which resolved, but has otherwise been in her usual state of health.  She has had similar spells with her Parkinson's previously.  On exam, the patient is relatively well-appearing.  She is hypertensive but her other vital signs are normal.  The remainder of the exam is as described above.  Neurologic exam is intact.  There is tenderness and swelling to the right knee but no other evidence of bony injury to the extremities, back, or torso.  She does appear mildly  dehydrated.  Overall I suspect most likely mechanical fall related to mobility problems with her Parkinson's, versus possible near syncope.  We will obtain basic labs, UA, x-ray of the right knee, and reassess.  ----------------------------------------- 11:31 PM on 01/22/2018 -----------------------------------------  Lab work-up is unremarkable.  The patient's UA shows some RBCs but no evidence of UTI, and the x-ray is negative.  She feels well after fluids and would like to go home.  She is hypertensive but she has not taken her blood pressure medication since the morning and has no symptoms related to the hypertension.  She is stable for discharge at this time.  Return precautions given, and she expresses understanding.  ____________________________________________   FINAL CLINICAL IMPRESSION(S) / ED DIAGNOSES  Final diagnoses:  Contusion of right knee, initial encounter  Dehydration      NEW MEDICATIONS STARTED DURING THIS VISIT:  New Prescriptions   No medications on file     Note:  This document was prepared using Dragon voice recognition software and may include unintentional dictation errors.    Dionne Bucy, MD 01/22/18 [phone]

## 2018-01-22 NOTE — ED Notes (Signed)
Pt up to the bedside commode 

## 2018-01-22 NOTE — ED Notes (Signed)
Patient informed provider that she does not remember the fall and she is dizzy post fall.

## 2018-01-22 NOTE — ED Notes (Signed)
Pt to the er for injuries sustained in a fall. Pt says she doesn't remember falling and she is not sure why she fell. Pt says she is feeling dizzy. Pt looks like she does not feel well. Pt reports both her and her husband had diarrhea this afternoon after eating at [employer] this morning.

## 2018-01-22 NOTE — Discharge Instructions (Addendum)
Return to the ER for new, worsening, persistent dizzy spells, balance problems, weakness, fever, difficulty walking, inability to bear weight on the knee, or any other new or worsening symptoms that concern you.

## 2018-01-22 NOTE — ED Triage Notes (Signed)
Pt to triage via wheelchair.  Pt fell inside the home. Pt has right knee pain and right hand pain and right shoulder pain.  No loc.  Pt alert.  Speech clear.  Hx parkinson's

## 2018-01-22 NOTE — ED Notes (Signed)
Radiology to the room for xrays.

## 2018-01-22 NOTE — ED Notes (Signed)
Patient reports 3 watery stools today. Patient c/o weakness.

## 2018-01-26 DIAGNOSIS — G20 Parkinson's disease: Secondary | ICD-10-CM | POA: Diagnosis not present

## 2018-01-26 DIAGNOSIS — R69 Illness, unspecified: Secondary | ICD-10-CM | POA: Diagnosis not present

## 2018-01-26 DIAGNOSIS — G4752 REM sleep behavior disorder: Secondary | ICD-10-CM | POA: Diagnosis not present

## 2018-02-18 DIAGNOSIS — R2681 Unsteadiness on feet: Secondary | ICD-10-CM | POA: Diagnosis not present

## 2018-02-18 DIAGNOSIS — R002 Palpitations: Secondary | ICD-10-CM | POA: Diagnosis not present

## 2018-02-18 DIAGNOSIS — R42 Dizziness and giddiness: Secondary | ICD-10-CM | POA: Diagnosis not present

## 2018-02-18 DIAGNOSIS — R0602 Shortness of breath: Secondary | ICD-10-CM | POA: Diagnosis not present

## 2018-02-18 DIAGNOSIS — R69 Illness, unspecified: Secondary | ICD-10-CM | POA: Diagnosis not present

## 2018-02-18 DIAGNOSIS — G20 Parkinson's disease: Secondary | ICD-10-CM | POA: Diagnosis not present

## 2018-02-18 DIAGNOSIS — I10 Essential (primary) hypertension: Secondary | ICD-10-CM | POA: Diagnosis not present

## 2018-03-12 ENCOUNTER — Telehealth: Payer: Self-pay | Admitting: Family Medicine

## 2018-03-12 ENCOUNTER — Other Ambulatory Visit: Payer: Self-pay | Admitting: Family Medicine

## 2018-03-12 DIAGNOSIS — I10 Essential (primary) hypertension: Secondary | ICD-10-CM

## 2018-03-12 MED ORDER — METOPROLOL SUCCINATE ER 50 MG PO TB24: ORAL_TABLET | ORAL | 0 refills | Status: DC

## 2018-03-12 NOTE — Telephone Encounter (Signed)
Pt requesting refill of metoprolol succinate (TOPROL-XL) 50 MG 24 hr tablet sent to [facility] on [address].    Pt states she dropped some pills and messed them up and pharmacy will not refill unless new RX is sent in.  States she only has one pill left.

## 2018-03-12 NOTE — Telephone Encounter (Signed)
Done

## 2018-03-12 NOTE — Telephone Encounter (Signed)
Message was left for patient on home answering machine per DPR form.

## 2018-03-25 DIAGNOSIS — E538 Deficiency of other specified B group vitamins: Secondary | ICD-10-CM | POA: Diagnosis not present

## 2018-03-25 DIAGNOSIS — G20 Parkinson's disease: Secondary | ICD-10-CM | POA: Diagnosis not present

## 2018-03-25 DIAGNOSIS — R202 Paresthesia of skin: Secondary | ICD-10-CM | POA: Diagnosis not present

## 2018-03-25 DIAGNOSIS — R200 Anesthesia of skin: Secondary | ICD-10-CM | POA: Diagnosis not present

## 2018-03-25 DIAGNOSIS — R69 Illness, unspecified: Secondary | ICD-10-CM | POA: Diagnosis not present

## 2018-04-07 DIAGNOSIS — R200 Anesthesia of skin: Secondary | ICD-10-CM | POA: Diagnosis not present

## 2018-04-07 DIAGNOSIS — R202 Paresthesia of skin: Secondary | ICD-10-CM | POA: Diagnosis not present

## 2018-04-08 DIAGNOSIS — R202 Paresthesia of skin: Secondary | ICD-10-CM | POA: Diagnosis not present

## 2018-04-08 DIAGNOSIS — R200 Anesthesia of skin: Secondary | ICD-10-CM | POA: Diagnosis not present

## 2018-04-15 ENCOUNTER — Emergency Department
Admission: EM | Admit: 2018-04-15 | Discharge: 2018-04-15 | Disposition: A | Discharge status: Home / Self Care | Payer: Medicare HMO | Attending: Emergency Medicine | Admitting: Emergency Medicine

## 2018-04-15 ENCOUNTER — Encounter: Payer: Self-pay | Admitting: Emergency Medicine

## 2018-04-15 ENCOUNTER — Emergency Department: Payer: Medicare HMO

## 2018-04-15 ENCOUNTER — Other Ambulatory Visit: Payer: Self-pay

## 2018-04-15 DIAGNOSIS — S0101XA Laceration without foreign body of scalp, initial encounter: Secondary | ICD-10-CM | POA: Diagnosis not present

## 2018-04-15 DIAGNOSIS — Y92002 Bathroom of unspecified non-institutional (private) residence single-family (private) house as the place of occurrence of the external cause: Secondary | ICD-10-CM | POA: Diagnosis not present

## 2018-04-15 DIAGNOSIS — Y999 Unspecified external cause status: Secondary | ICD-10-CM | POA: Insufficient documentation

## 2018-04-15 DIAGNOSIS — Y939 Activity, unspecified: Secondary | ICD-10-CM | POA: Diagnosis not present

## 2018-04-15 DIAGNOSIS — R55 Syncope and collapse: Secondary | ICD-10-CM | POA: Diagnosis not present

## 2018-04-15 DIAGNOSIS — I10 Essential (primary) hypertension: Secondary | ICD-10-CM | POA: Insufficient documentation

## 2018-04-15 DIAGNOSIS — M542 Cervicalgia: Secondary | ICD-10-CM | POA: Diagnosis not present

## 2018-04-15 DIAGNOSIS — W01198A Fall on same level from slipping, tripping and stumbling with subsequent striking against other object, initial encounter: Secondary | ICD-10-CM | POA: Diagnosis not present

## 2018-04-15 DIAGNOSIS — R51 Headache: Secondary | ICD-10-CM | POA: Diagnosis not present

## 2018-04-15 DIAGNOSIS — S199XXA Unspecified injury of neck, initial encounter: Secondary | ICD-10-CM | POA: Diagnosis not present

## 2018-04-15 DIAGNOSIS — Z79899 Other long term (current) drug therapy: Secondary | ICD-10-CM | POA: Insufficient documentation

## 2018-04-15 DIAGNOSIS — G20 Parkinson's disease: Secondary | ICD-10-CM | POA: Insufficient documentation

## 2018-04-15 DIAGNOSIS — S0990XA Unspecified injury of head, initial encounter: Secondary | ICD-10-CM | POA: Diagnosis not present

## 2018-04-15 LAB — BASIC METABOLIC PANEL
ANION GAP: 8 (ref 5–15)
BUN: 16 mg/dL (ref 8–23)
CALCIUM: 9.5 mg/dL (ref 8.9–10.3)
CO2: 29 mmol/L (ref 22–32)
CREATININE: 0.7 mg/dL (ref 0.44–1.00)
Chloride: 100 mmol/L (ref 98–111)
GFR calc Af Amer: >60 mL/min (ref >60)
GFR calc non Af Amer: >60 mL/min (ref >60)
GLUCOSE: 177 mg/dL — AB (ref 70–99)
Potassium: 4.1 mmol/L (ref 3.5–5.1)
Sodium: 137 mmol/L (ref 135–145)

## 2018-04-15 LAB — CBC
HCT: 45.9 % (ref 36.0–46.0)
Hemoglobin: 15.5 g/dL — ABNORMAL HIGH (ref 12.0–15.0)
MCH: 30.7 pg (ref 26.0–34.0)
MCHC: 33.8 g/dL (ref 30.0–36.0)
MCV: 90.9 fL (ref 80.0–100.0)
PLATELETS: 317 10*3/uL (ref 150–400)
RBC: 5.05 MIL/uL (ref 3.87–5.11)
RDW: 12.1 % (ref 11.5–15.5)
WBC: 8.3 10*3/uL (ref 4.0–10.5)
nRBC: 0 % (ref 0.0–0.2)

## 2018-04-15 LAB — TROPONIN I

## 2018-04-15 MED ORDER — LIDOCAINE HCL (PF) 1 % IJ SOLN
INTRAMUSCULAR | Status: AC
  Administered 2018-04-15
  Filled 2018-04-15: qty 5

## 2018-04-15 NOTE — Discharge Instructions (Addendum)
Please seek medical attention for any high fevers, chest pain, shortness of breath, change in behavior, persistent vomiting, bloody stool or any other new or concerning symptoms.  

## 2018-04-15 NOTE — ED Provider Notes (Signed)
[facility] Emergency Department Provider Note   ____________________________________________   I have reviewed the triage vital signs and the nursing notes.   HISTORY  Chief Complaint Loss of Consciousness   History limited by: Not Limited   HPI Destiny Torres is a 72 y.o. female who presents to the emergency department today because of concerns for head injury after a fall.  The patient states that she does occasionally have near syncopal spells.  She thinks this is related to her Parkinson's.  This occurred for her again today.  She was in the bathroom and hit the back of her head on the bathtub.  She denies any loss of consciousness.  She is having some very mild pain to the back of her head but denies any other pain.  Denies any pain in her extremities.   Per medical record review patient has a history of GERD, HTN.  Past Medical History:  Diagnosis Date  . Arthritis   . GERD (gastroesophageal reflux disease)   . Hypertension     Patient Active Problem List   Diagnosis Date Noted  . Gait instability 06/19/2016  . Parkinson's disease (HCC) 06/04/2016  . Abdominal pain 02/07/2016  . Cholelithiasis 01/30/2016  . Palpitations 01/24/2016  . Shortness of breath 01/24/2016  . Epigastric pain   . SBO (small bowel obstruction) (HCC) 01/13/2016  . Tremor of both hands 10/03/2015  . Arthritis 01/02/2015  . Acid reflux 01/02/2015  . Benign hypertension 01/02/2015  . Dizziness 01/02/2015  . Essential (primary) hypertension 01/02/2015  . Gastro-esophageal reflux disease without esophagitis 01/02/2015  . 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 HAND SURGERY Right 04/2014  . TONSILLECTOMY      Prior to Admission medications   Medication Sig Start Date End Date Taking? Authorizing Provider  aspirin 81 MG EC tablet Take 1 tablet (81 mg total) by mouth daily. Swallow whole. Patient not taking: Reported on  09/08/2017 01/22/16   Emily Filbert, MD  b complex vitamins capsule Take by mouth.    [provider]  carbidopa-levodopa (SINEMET IR) 25-250 MG tablet Take 1 tablet by mouth 6 (six) times daily.  05/22/16 09/08/17  [provider]  cholecalciferol (VITAMIN D) 1000 units tablet Take 1,000 Units by mouth daily.    [provider]  famotidine (PEPCID) 40 MG tablet Take 40 mg by mouth daily.    [provider]  Magnesium 500 MG CAPS Take 500 mg by mouth daily.     [provider]  Melatonin 5 MG CAPS Take by mouth as needed.    [provider]  metoprolol succinate (TOPROL-XL) 50 MG 24 hr tablet Take with or immediately following a meal. 03/12/18   Chrismon, Jodell Cipro, PA  vitamin B-12 (CYANOCOBALAMIN) 1000 MCG tablet Take 1,000 mcg by mouth daily.    [provider]  vitamin C (ASCORBIC ACID) 500 MG tablet Take 500 mg by mouth daily.     [provider]  vitamin E 400 UNIT capsule Take 400 Units by mouth daily.     [provider]    Allergies Penicillins and Streptomycin  Family History  Problem Relation Age of Onset  . Heart disease Mother   . Arthritis Mother   . Heart disease Father   . Stroke Father     Social History Social History   Tobacco Use  . Smoking status: Never Smoker  . Smokeless tobacco: Never Used  Substance  Use Topics  . Alcohol use: No    Alcohol/week: 0.0 standard drinks  . Drug use: No    Review of Systems Constitutional: No fever/chills Eyes: No visual changes. ENT: No sore throat. Cardiovascular: Denies chest pain. Respiratory: Denies shortness of breath. Gastrointestinal: No abdominal pain.  No nausea, no vomiting.  No diarrhea.   Genitourinary: Negative for dysuria. Musculoskeletal: Negative for back pain. Skin: Positive for laceration to occiput Neurological: Positive for near syncopal episode.  ____________________________________________   PHYSICAL  EXAM:  VITAL SIGNS: ED Triage Vitals  Enc Vitals Group     BP 04/15/18 2030 (!) 218/84     Pulse Rate 04/15/18 2030 66     Resp 04/15/18 2030 20     Temp 04/15/18 2030 98.2 F (36.8 C)     Temp Source 04/15/18 2030 Oral     SpO2 04/15/18 2030 97 %     Weight 04/15/18 2029 115 lb (52.2 kg)     Height 04/15/18 2029 4\' 11"  (1.499 m)     Head Circumference --      Peak Flow --      Pain Score 04/15/18 2029 2   Constitutional: Alert and oriented.  Eyes: Conjunctivae are normal.  ENT      Head: Normocephalic. 1.5 cm laceration to occiput.      Nose: No congestion/rhinnorhea.      Mouth/Throat: Mucous membranes are moist.      Neck: No stridor. Hematological/Lymphatic/Immunilogical: No cervical lymphadenopathy. Cardiovascular: Normal rate, regular rhythm.  No murmurs, rubs, or gallops.  Respiratory: Normal respiratory effort without tachypnea nor retractions. Breath sounds are clear and equal bilaterally. No wheezes/rales/rhonchi. Gastrointestinal: Soft and non tender. No rebound. No guarding.  Genitourinary: Deferred Musculoskeletal: Normal range of motion in all extremities. No lower extremity edema. No tenderness over hips, shoulders, elbows or wrists.  Neurologic:  Normal speech and language. No gross focal neurologic deficits are appreciated.  Skin:  Skin is warm, dry and intact. No rash noted. Psychiatric: Mood and affect are normal. Speech and behavior are normal. Patient exhibits appropriate insight and judgment.  ____________________________________________    LABS (pertinent positives/negatives)  CBC wbc 8.3, hgb 15.5, plt 317 BMP wnl except glu 177 Trop <0.03  ____________________________________________   EKG  I, Phineas Semen, attending physician, personally viewed and interpreted this EKG  EKG Time: 2040 Rate: 69 Rhythm: sinus rhythm Axis: left axis deviation Intervals: qtc 432 QRS: narrow ST changes: no st elevation Impression: abnormal  ekg   ____________________________________________    RADIOLOGY  CT head/cervical spine  No osseous injury, no intracranial bleed ____________________________________________   PROCEDURES  Procedures  LACERATION REPAIR Performed by: Phineas Semen Authorized by: Phineas Semen Consent: Verbal consent obtained. Risks and benefits: risks, benefits and alternatives were discussed Consent given by: patient Patient identity confirmed: provided demographic data Prepped and Draped in normal sterile fashion Wound explored  Laceration Location: occiput  Laceration Length: 1.5 cm  No Foreign Bodies seen or palpated  Anesthesia: local infiltration  Local anesthetic: lidocaine 1% without epinephrine  Anesthetic total: 2 ml  Irrigation method: syringe Amount of cleaning: standard  Skin closure: staples  Number of staples: 2   Patient tolerance: Patient tolerated the procedure well with no immediate complications.  ____________________________________________   INITIAL IMPRESSION / ASSESSMENT AND PLAN / ED COURSE  Pertinent labs & imaging results that were available during my care of the patient were reviewed by me and considered in my medical decision making (see chart for details).   Patient  presented to the emergency department today after a episode.  Patient states that she gets these episodes with some regularity.  Patient did suffer a 1.5 cm laceration to her occiput which was stapled closed.  Patient had a head and cervical spine CT performed which did not show any acute osseous injury or intracranial bleed.  Discussed this with the patient.  Will plan on discharging her   ____________________________________________   FINAL CLINICAL IMPRESSION(S) / ED DIAGNOSES  Final diagnoses:  Near syncope  Laceration of scalp, initial encounter     Note: This dictation was prepared with Dragon dictation. Any transcriptional errors that result from this process are  unintentional     Phineas SemenGoodman, Oumou Smead, MD 04/15/18 2330

## 2018-04-15 NOTE — ED Triage Notes (Signed)
Patient ambulatory to triage with steady gait, without difficulty or distress noted; pt reports syncopal episode PTA, st hit back of head of bathtub; pt reports some pain to back of head but denies any other c/o

## 2018-05-07 ENCOUNTER — Encounter: Payer: Self-pay | Admitting: Family Medicine

## 2018-05-07 ENCOUNTER — Ambulatory Visit (INDEPENDENT_AMBULATORY_CARE_PROVIDER_SITE_OTHER): Payer: Medicare HMO | Admitting: Family Medicine

## 2018-05-07 VITALS — BP 145/71 | HR 68 | Temp 97.7°F | Resp 16 | Wt 116.0 lb

## 2018-05-07 DIAGNOSIS — Z4802 Encounter for removal of sutures: Secondary | ICD-10-CM | POA: Diagnosis not present

## 2018-05-07 NOTE — Progress Notes (Signed)
Patient: Destiny Torres FemaleClaudine Mouton    DOB: 03/06/1946   72 y.o.   MRN: [id number] Visit Date: 05/07/2018  Today's Provider: Dortha Kernennis Woodley Petzold, PA   Chief Complaint  Patient presents with  . Suture / Staple Removal   Subjective:    HPI  Patient is here to have 2 staples removed from the back of her head. Placed 04/15/2018 due to a fall related to her Parkinson's disease. Did not have any LOC when she fell at home. Seems stable today without significant neurologic symptoms.  Past Medical History:  Diagnosis Date  . Arthritis   . GERD (gastroesophageal reflux disease)   . Hypertension    Past Surgical History:  Procedure Laterality Date  . HAND SURGERY Right 04/2014  . TONSILLECTOMY     Family History  Problem Relation Age of Onset  . Heart disease Mother   . Arthritis Mother   . Heart disease Father   . Stroke Father       Allergies  Allergen Reactions  . Penicillins     patient unsure of reaction  . Streptomycin Rash    Current Outpatient Medications:  .  b complex vitamins capsule, Take by mouth., Disp: , Rfl:  .  carbidopa-levodopa (SINEMET IR) 25-250 MG tablet, Take 1 tablet by mouth 6 (six) times daily. , Disp: , Rfl:  .  cholecalciferol (VITAMIN D) 1000 units tablet, Take 1,000 Units by mouth daily., Disp: , Rfl:  .  Magnesium 500 MG CAPS, Take 500 mg by mouth daily. , Disp: , Rfl:  .  Melatonin 5 MG CAPS, Take by mouth as needed., Disp: , Rfl:  .  metoprolol succinate (TOPROL-XL) 50 MG 24 hr tablet, Take with or immediately following a meal., Disp: 90 tablet, Rfl: 0 .  vitamin B-12 (CYANOCOBALAMIN) 1000 MCG tablet, Take 1,000 mcg by mouth daily., Disp: , Rfl:  .  vitamin C (ASCORBIC ACID) 500 MG tablet, Take 500 mg by mouth daily. , Disp: , Rfl:  .  vitamin E 400 UNIT capsule, Take 400 Units by mouth daily. , Disp: , Rfl:  .  aspirin 81 MG EC tablet, Take 1 tablet (81 mg total) by mouth daily. Swallow whole. (Patient not taking: Reported on 05/07/2018), Disp:  30 tablet, Rfl: 12 .  famotidine (PEPCID) 40 MG tablet, Take 40 mg by mouth daily., Disp: , Rfl:   Review of Systems  Constitutional: Negative for appetite change, chills, fatigue and fever.  Respiratory: Negative for chest tightness and shortness of breath.   Cardiovascular: Negative for chest pain and palpitations.  Gastrointestinal: Negative for abdominal pain, nausea and vomiting.  Neurological: Negative for dizziness and weakness.    Social History   Tobacco Use  . Smoking status: Never Smoker  . Smokeless tobacco: Never Used  Substance Use Topics  . Alcohol use: No    Alcohol/week: 0.0 standard drinks   Objective:   BP (!) 145/71 (BP Location: Right Arm, Patient Position: Sitting, Cuff Size: Normal)   Pulse 68   Temp 97.7 F (36.5 C) (Oral)   Resp 16   Wt 116 lb (52.6 kg)   SpO2 98%   BMI 23.43 kg/m  Vitals:   05/07/18 1126  BP: (!) 145/71  Pulse: 68  Resp: 16  Temp: 97.7 F (36.5 C)  TempSrc: Oral  SpO2: 98%  Weight: 116 lb (52.6 kg)   Physical Exam  Constitutional: She is oriented to person, place, and time. She appears well-developed and well-nourished. No  distress.  HENT:  Head: Normocephalic and atraumatic.  Right Ear: Hearing normal.  Left Ear: Hearing normal.  Nose: Nose normal.  Eyes: Conjunctivae and lids are normal. Right eye exhibits no discharge. Left eye exhibits no discharge. No scleral icterus.  Pulmonary/Chest: Effort normal. No respiratory distress.  Neurological: She is alert and oriented to person, place, and time.  Normal speech. Slow and slightly shuffling gait. Very little facial expression.  Skin: Skin is intact. No lesion and no rash noted.  Two staples in occipital scalp embedded in a 2 cm scab. No sign of infection.   Psychiatric: She has a normal mood and affect. Her speech is normal and behavior is normal. Thought content normal.      Assessment & Plan:     1. Encounter for removal of sutures Laceration of occipital scalp  with 2 staples in place. Secondary to a fall against the bathtub at home on 04-15-18. Went to the ER and staples put in to approximate the wound edges. Well healed laceration and concerned about getting hair washed and "perm" with staples still in place. Removed staples and no bleeding or sign of infection. May proceed with hairdresser appointment. Proceed with neurology follow up scheduled with Dr. Sherryll Burger on 05-12-18.       Dortha Kern, PA  [facility]

## 2018-05-15 IMAGING — CR DG FOOT COMPLETE 3+V*L*
1 series · 3 of 3 positions shown · non-contrast
Comparison: No recent prior .

CLINICAL DATA: Foot pain.  Initial evaluation.

EXAM:
LEFT FOOT - COMPLETE 3+ VIEW

[Series 1: dg foot complete left · 0.14mm/px · 3 of 3 slices shown]
[im 1/3]
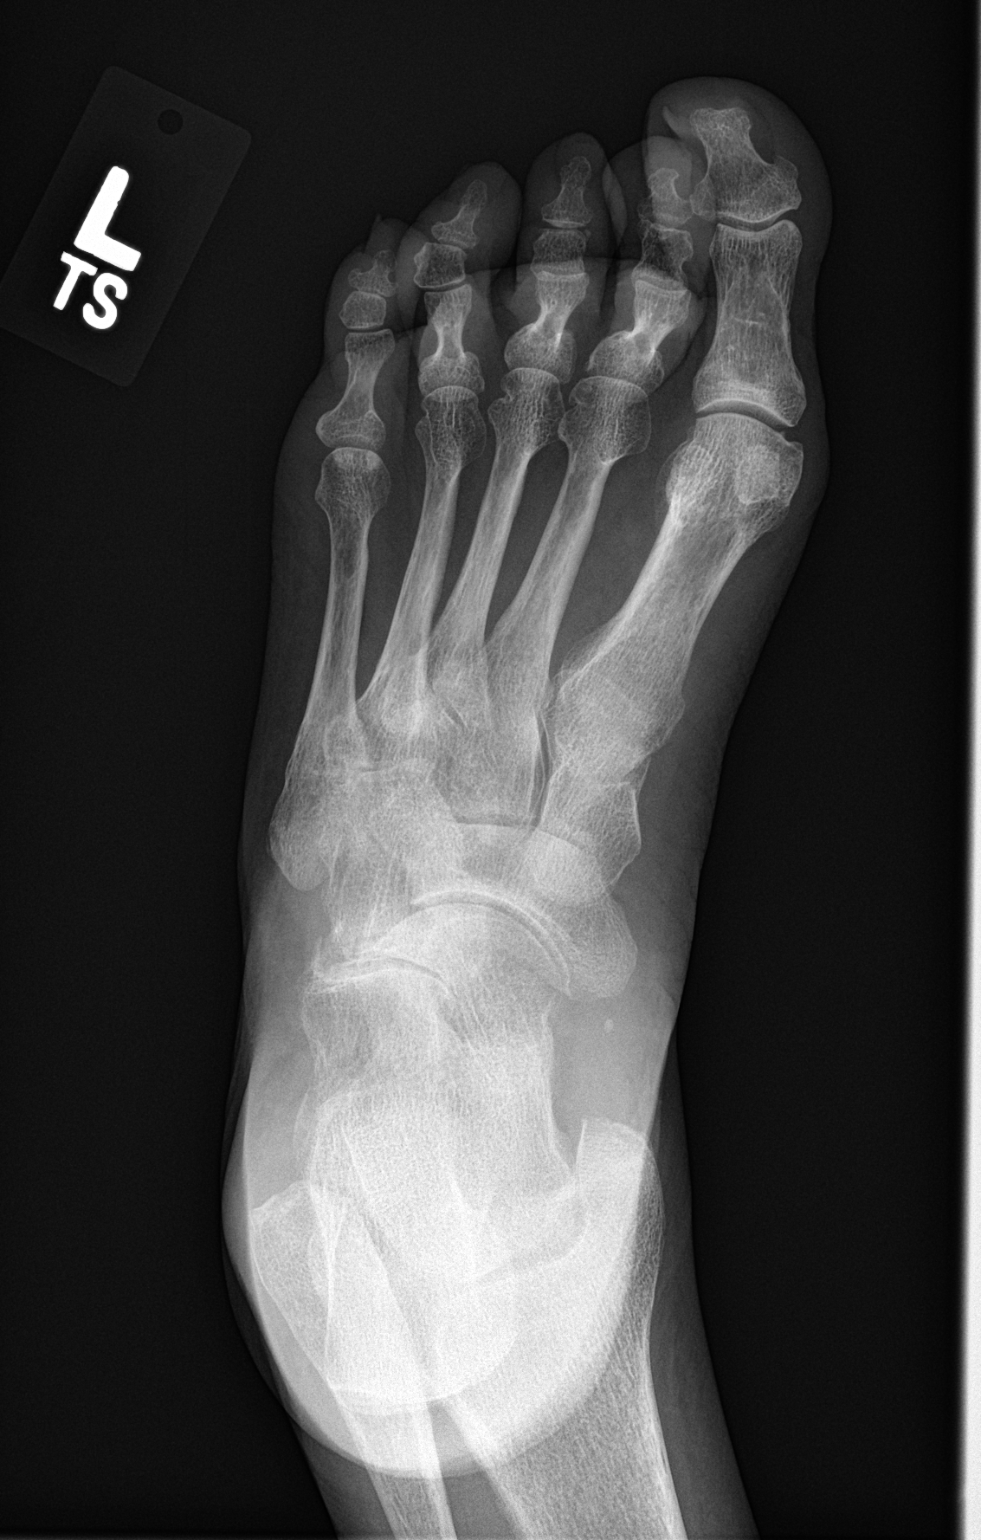
[im 2/3]
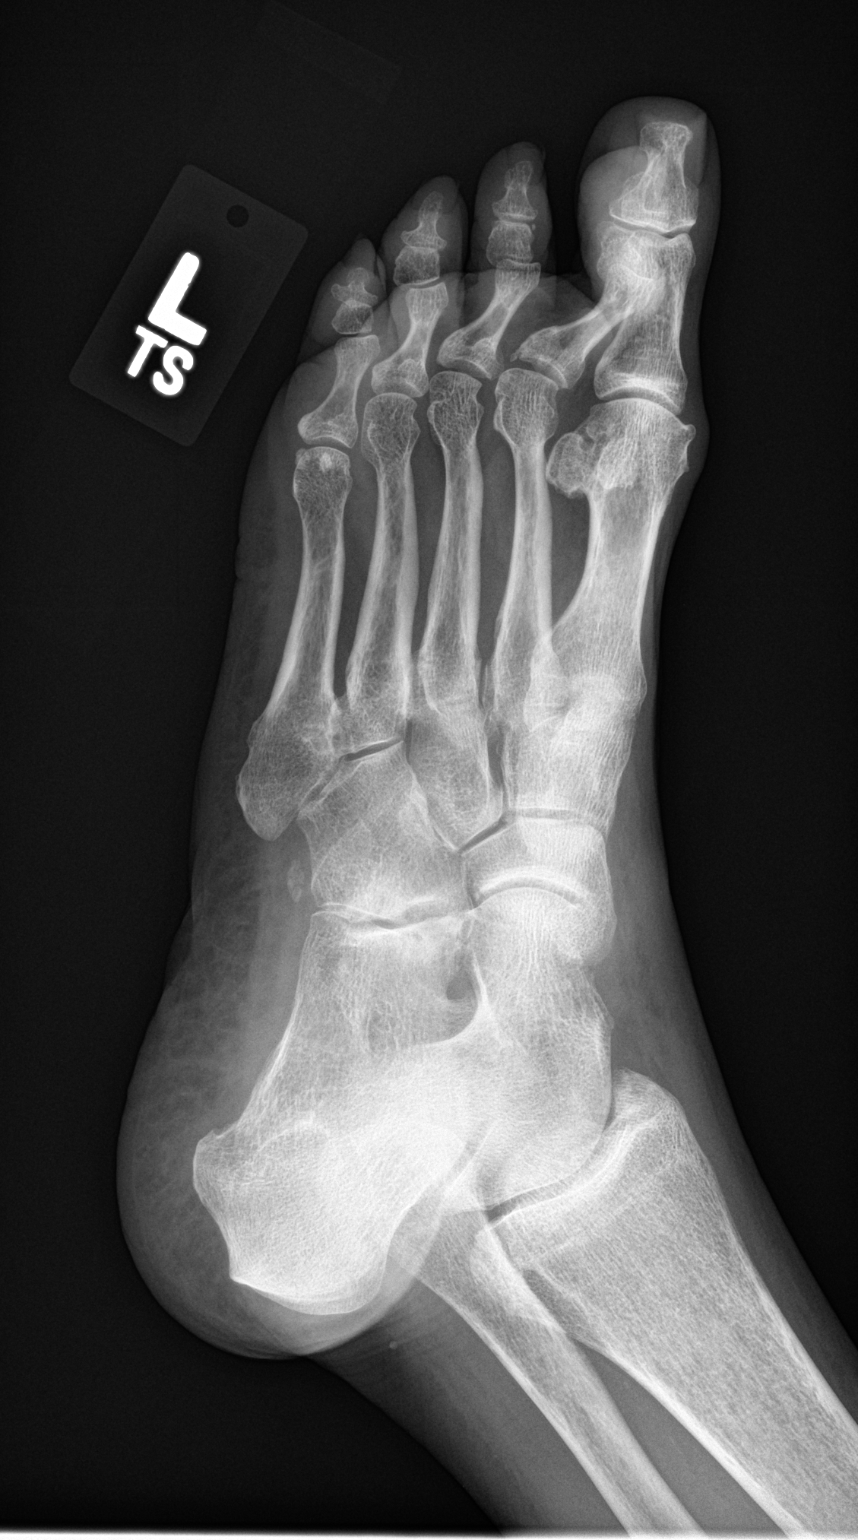
[im 3/3]
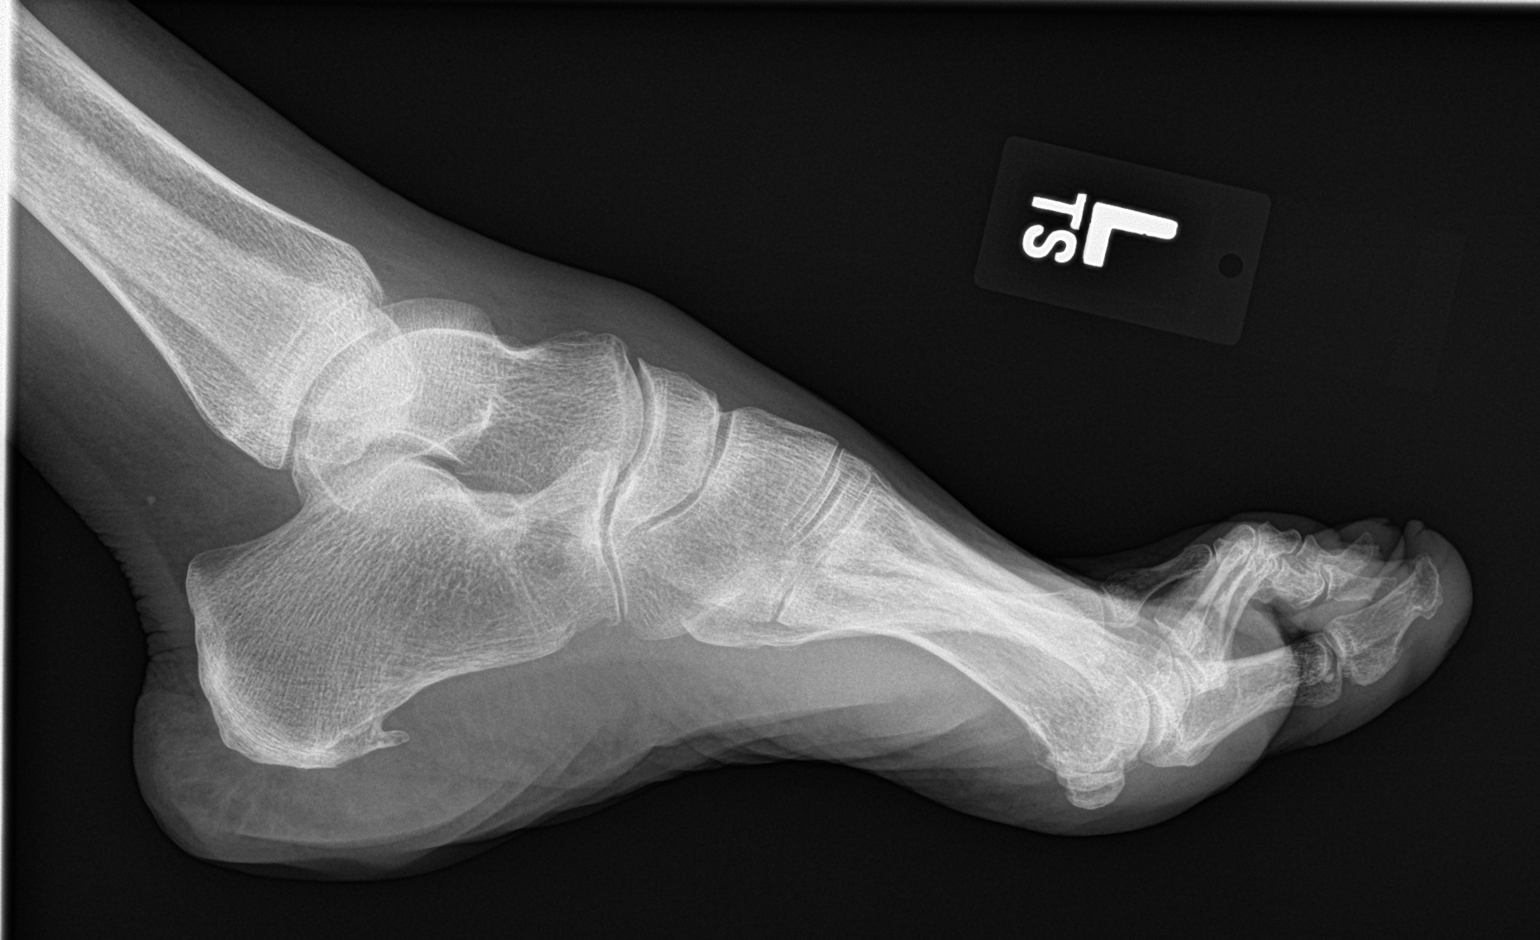

[3 of 3 positions shown; findings below may reference images not displayed]

FINDINGS: Diffuse degenerative change . No acute bony or joint abnormality
identified. No evidence of fracture or dislocation.
IMPRESSION: Diffuse degenerative change.  No acute abnormality.

## 2018-05-21 DIAGNOSIS — G20 Parkinson's disease: Secondary | ICD-10-CM | POA: Diagnosis not present

## 2018-06-08 DIAGNOSIS — G20 Parkinson's disease: Secondary | ICD-10-CM | POA: Diagnosis not present

## 2018-06-08 DIAGNOSIS — R262 Difficulty in walking, not elsewhere classified: Secondary | ICD-10-CM | POA: Diagnosis not present

## 2018-06-08 DIAGNOSIS — R278 Other lack of coordination: Secondary | ICD-10-CM | POA: Diagnosis not present

## 2018-06-15 DIAGNOSIS — G20 Parkinson's disease: Secondary | ICD-10-CM | POA: Diagnosis not present

## 2018-06-15 DIAGNOSIS — R278 Other lack of coordination: Secondary | ICD-10-CM | POA: Diagnosis not present

## 2018-06-15 DIAGNOSIS — R262 Difficulty in walking, not elsewhere classified: Secondary | ICD-10-CM | POA: Diagnosis not present

## 2018-06-24 ENCOUNTER — Telehealth: Payer: Self-pay

## 2018-06-24 ENCOUNTER — Telehealth: Payer: Self-pay | Admitting: Family Medicine

## 2018-06-24 DIAGNOSIS — R278 Other lack of coordination: Secondary | ICD-10-CM | POA: Diagnosis not present

## 2018-06-24 DIAGNOSIS — G20 Parkinson's disease: Secondary | ICD-10-CM | POA: Diagnosis not present

## 2018-06-24 DIAGNOSIS — R262 Difficulty in walking, not elsewhere classified: Secondary | ICD-10-CM | POA: Diagnosis not present

## 2018-06-24 NOTE — Telephone Encounter (Signed)
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    Destiny Torres went out to see Destiny Torres today and her BP was 212/85 and after she used the restroom it dropped to 187/67.  She does have seizure like "spells" where she she zones out for a minute or so and will drool.  Efraim KaufmannMelissa is calling [name]'s Neurologist today also about this.

## 2018-06-24 NOTE — Telephone Encounter (Signed)
Patients husband has called the office with concerns of elevated blood pressure. Patients husband states on phone that this afternoon while at surgeons office patient repordtly had a "spell" sitting in waiting room. I asked husband to elaborate and he states that patient "blacked out: and when she came to she was drooling on her self and was very confused. Patient states that nurse checked patients blood pressure at visit around 1:45PM and patients blood pressure was 222/89. Patients husband states that patient was still confused even after leaving surgeons office. After discussing and reviewing over chart with Dr. Sherrie Mustache it was determined that patient be taken to the nearest ED for evaluation. Patients husband understood and will be taking patient to hospital. Renette Butters

## 2018-06-25 ENCOUNTER — Encounter: Payer: Self-pay | Admitting: Family Medicine

## 2018-06-25 ENCOUNTER — Ambulatory Visit (INDEPENDENT_AMBULATORY_CARE_PROVIDER_SITE_OTHER): Payer: Medicare HMO | Admitting: Family Medicine

## 2018-06-25 VITALS — BP 178/70 | HR 64 | Resp 16 | Wt 115.6 lb

## 2018-06-25 DIAGNOSIS — R55 Syncope and collapse: Secondary | ICD-10-CM

## 2018-06-25 DIAGNOSIS — G20 Parkinson's disease: Secondary | ICD-10-CM

## 2018-06-25 DIAGNOSIS — I10 Essential (primary) hypertension: Secondary | ICD-10-CM | POA: Diagnosis not present

## 2018-06-25 MED ORDER — AMLODIPINE BESYLATE 2.5 MG PO TABS: 5.0000 mg | ORAL_TABLET | Freq: Every day | ORAL | 0 refills | Status: DC

## 2018-06-25 NOTE — Progress Notes (Signed)
Patient: Destiny MoutonGlenda Torres Female    DOB: 04/15/1946   73 y.o.   MRN: [id number] Visit Date: 06/25/2018  Today's Provider: Dortha Kernennis Caitlynn Ju, PA   Chief Complaint  Patient presents with  . Hypertension   Subjective:     HPI  Patient is coming to see provider for elevated blood pressure. Per telephone note yesterday [facility] and patient's blood pressure was 212/85 and after patient used the restroom it dropped to 187/67. Patient's husband also called yesterday concern about his wife blood pressure he reported that while at surgeons office patient reportly had a "spell" sitting in waiting room, he states that patient "blacked out: and when she came to she was drooling on her self and was very confused. Patient states that nurse checked patients blood pressure at visit around 1:45PM and patients blood pressure was 222/89. Patients husband states that patient was still confused even after leaving surgeons office,pt's husband was advised to take patient to the nearest ED for evaluation.  Associated symptoms: palpitations, dizziness, Visual disturbances and lightheadedness. Denies: Chest pain, SOB. Patient's husband reports that her blood pressure this morning was 222/89.  Past Medical History:  Diagnosis Date  . Arthritis   . GERD (gastroesophageal reflux disease)   . Hypertension    Patient Active Problem List   Diagnosis Date Noted  . Gait instability 06/19/2016  . Parkinson's disease (HCC) 06/04/2016  . Abdominal pain 02/07/2016  . Cholelithiasis 01/30/2016  . Palpitations 01/24/2016  . Shortness of breath 01/24/2016  . Epigastric pain   . SBO (small bowel obstruction) (HCC) 01/13/2016  . Tremor of both hands 10/03/2015  . Arthritis 01/02/2015  . Acid reflux 01/02/2015  . Benign hypertension 01/02/2015  . Dizziness 01/02/2015  . Essential (primary) hypertension 01/02/2015  . Gastro-esophageal reflux disease without esophagitis  01/02/2015  . Lax, ligament 11/09/2014   Past Surgical History:  Procedure Laterality Date  . HAND SURGERY Right 04/2014  . TONSILLECTOMY     Family History  Problem Relation Age of Onset  . Heart disease Mother   . Arthritis Mother   . Heart disease Father   . Stroke Father    Allergies  Allergen Reactions  . Penicillins     patient unsure of reaction  . Streptomycin Rash    Current Outpatient Medications:  .  carbidopa-levodopa (SINEMET IR) 25-250 MG tablet, Take 1 tablet by mouth 6 (six) times daily. , Disp: , Rfl:  .  Magnesium 500 MG CAPS, Take 500 mg by mouth daily. , Disp: , Rfl:  .  Melatonin 5 MG CAPS, Take by mouth as needed., Disp: , Rfl:  .  metoprolol succinate (TOPROL-XL) 50 MG 24 hr tablet, Take with or immediately following a meal., Disp: 90 tablet, Rfl: 0 .  aspirin 81 MG EC tablet, Take 1 tablet (81 mg total) by mouth daily. Swallow whole. (Patient not taking: Reported on 05/07/2018), Disp: 30 tablet, Rfl: 12 .  b complex vitamins capsule, Take by mouth., Disp: , Rfl:  .  cholecalciferol (VITAMIN D) 1000 units tablet, Take 1,000 Units by mouth daily., Disp: , Rfl:  .  famotidine (PEPCID) 40 MG tablet, Take 40 mg by mouth daily., Disp: , Rfl:  .  vitamin B-12 (CYANOCOBALAMIN) 1000 MCG tablet, Take 1,000 mcg by mouth daily., Disp: , Rfl:  .  vitamin C (ASCORBIC ACID) 500 MG tablet, Take 500 mg by mouth daily. , Disp: , Rfl:  .  vitamin E 400 UNIT capsule, Take 400 Units by mouth daily. , Disp: , Rfl:   Review of Systems  Social History   Tobacco Use  . Smoking status: Never Smoker  . Smokeless tobacco: Never Used  Substance Use Topics  . Alcohol use: No    Alcohol/week: 0.0 standard drinks     Objective:   BP (!) 178/70 (BP Location: Left Arm, Cuff Size: Normal)   Pulse 64   Resp 16   Wt 115 lb 9.6 oz (52.4 kg)   BMI 23.35 kg/m    BP Readings from Last 3 Encounters:  06/25/18 (!) 178/70  05/07/18 (!) 145/71  04/15/18 (!) 218/84   Vitals:    06/25/18 0951 06/25/18 1002  BP: (!) 189/73 (!) 178/70  Pulse: 64   Resp: 16   Weight: 115 lb 9.6 oz (52.4 kg)    Physical Exam Constitutional:      General: She is not in acute distress.    Appearance: She is well-developed.  HENT:     Head: Normocephalic and atraumatic.     Right Ear: Hearing normal.     Left Ear: Hearing normal.     Nose: Nose normal.  Eyes:     General: Lids are normal. No scleral icterus.       Right eye: No discharge.        Left eye: No discharge.     Conjunctiva/sclera: Conjunctivae normal.  Neck:     Musculoskeletal: Normal range of motion and neck supple.  Cardiovascular:     Rate and Rhythm: Normal rate and regular rhythm.     Heart sounds: Normal heart sounds.  Pulmonary:     Effort: Pulmonary effort is normal. No respiratory distress.  Abdominal:     Palpations: Abdomen is soft.  Musculoskeletal: Normal range of motion.  Skin:    Findings: No lesion or rash.  Neurological:     Mental Status: She is alert and oriented to person, place, and time.     Deep Tendon Reflexes: Reflexes normal.     Comments: No unilateral weakness. Balance poor with history of Parkinson's-like Disease.  Psychiatric:        Speech: Speech normal.        Behavior: Behavior normal.        Thought Content: Thought content normal.       Assessment & Plan    1. Essential (primary) hypertension BP higher than in the past yesterday with episode of near syncope with drooling. No loss of bowel or bladder control. No residual unilateral weakness or facial weakness. Facial expression minimal due to Parkinson's Disease. Continues to take Metoprolol Succinate 50 mg qd. Pulse in the 60's today. With systolic BP still high, will add a little Amlodipine (2.5 mg qd) to the regular Metoprolol Succinate dosage and check routine labs. Husband will check BP at home for signs of pulse or diastolic drops. Recheck pending reports. - CBC with Differential/Platelet - Comprehensive metabolic  panel - amLODipine (NORVASC) 2.5 MG tablet; Take 2 tablets (5 mg total) by mouth daily.  Dispense: 30 tablet; Refill: 0  2. Parkinson's disease (HCC) Presently feels tremor and balance issues better on the Sinemet 25-250 mg one tablet 6 times a day. Check routine labs and continue follow up with therapist and neurologist regularly. - CBC with Differential/Platelet - Comprehensive metabolic panel  3. Near syncope Husband states she has had infrequent episodes of near syncope with some drooling as happened yesterday. BP was very high but  the episode improved shortly. Will check labs and try to adjust BP control. Scans of head on 04-15-18 did not show evidence of infarct. Should continue follow up with neurologist. Advised husband to take her to an ER if episodes last longer than 10-15 minutes and he agrees. - CBC with Differential/Platelet - Comprehensive metabolic panel     Dortha Kern, PA  [facility]

## 2018-06-26 ENCOUNTER — Telehealth: Payer: Self-pay

## 2018-06-26 LAB — CBC WITH DIFFERENTIAL/PLATELET
BASOS ABS: 0 10*3/uL (ref 0.0–0.2)
BASOS: 1 %
EOS (ABSOLUTE): 0.1 10*3/uL (ref 0.0–0.4)
Eos: 1 %
Hematocrit: 42.5 % (ref 34.0–46.6)
Hemoglobin: 14 g/dL (ref 11.1–15.9)
Immature Grans (Abs): 0.1 10*3/uL (ref 0.0–0.1)
Immature Granulocytes: 1 %
LYMPHS: 29 %
Lymphocytes Absolute: 2.1 10*3/uL (ref 0.7–3.1)
MCH: 29.9 pg (ref 26.6–33.0)
MCHC: 32.9 g/dL (ref 31.5–35.7)
MCV: 91 fL (ref 79–97)
MONOS ABS: 0.6 10*3/uL (ref 0.1–0.9)
Monocytes: 8 %
NEUTROS PCT: 60 %
Neutrophils Absolute: 4.5 10*3/uL (ref 1.4–7.0)
PLATELETS: 284 10*3/uL (ref 150–450)
RBC: 4.69 x10E6/uL (ref 3.77–5.28)
RDW: 12.2 % (ref 11.7–15.4)
WBC: 7.3 10*3/uL (ref 3.4–10.8)

## 2018-06-26 LAB — COMPREHENSIVE METABOLIC PANEL
ALK PHOS: 110 IU/L (ref 39–117)
AST: 15 IU/L (ref 0–40)
Albumin/Globulin Ratio: 1.4 (ref 1.2–2.2)
Albumin: 3.9 g/dL (ref 3.7–4.7)
BILIRUBIN TOTAL: 0.7 mg/dL (ref 0.0–1.2)
BUN/Creatinine Ratio: 18 (ref 12–28)
BUN: 14 mg/dL (ref 8–27)
CHLORIDE: 98 mmol/L (ref 96–106)
CO2: 27 mmol/L (ref 20–29)
Calcium: 9.8 mg/dL (ref 8.7–10.3)
Creatinine, Ser: 0.76 mg/dL (ref 0.57–1.00)
GFR calc non Af Amer: 78 mL/min/{1.73_m2} (ref >59)
GFR, EST AFRICAN AMERICAN: 89 mL/min/{1.73_m2} (ref >59)
GLUCOSE: 90 mg/dL (ref 65–99)
Globulin, Total: 2.7 g/dL (ref 1.5–4.5)
Potassium: 4.9 mmol/L (ref 3.5–5.2)
Sodium: 138 mmol/L (ref 134–144)
TOTAL PROTEIN: 6.6 g/dL (ref 6.0–8.5)

## 2018-06-26 NOTE — Telephone Encounter (Signed)
-----   Message from Tamsen Roers, [place] sent at 06/26/2018 10:53 AM EST ----- All blood tests are normal. Recheck BP daily and call report of readings in 10 days.

## 2018-06-26 NOTE — Telephone Encounter (Signed)
Patient called back to let her know about the results because she had forgotten already I repeated them and asked to speak with Leonette Most her husband who is on her DPR and advised as directed below.

## 2018-06-26 NOTE — Telephone Encounter (Signed)
Patient advised as directed below. 

## 2018-06-29 DIAGNOSIS — G20 Parkinson's disease: Secondary | ICD-10-CM | POA: Diagnosis not present

## 2018-06-29 DIAGNOSIS — R262 Difficulty in walking, not elsewhere classified: Secondary | ICD-10-CM | POA: Diagnosis not present

## 2018-06-29 DIAGNOSIS — R278 Other lack of coordination: Secondary | ICD-10-CM | POA: Diagnosis not present

## 2018-07-01 DIAGNOSIS — R278 Other lack of coordination: Secondary | ICD-10-CM | POA: Diagnosis not present

## 2018-07-01 DIAGNOSIS — R262 Difficulty in walking, not elsewhere classified: Secondary | ICD-10-CM | POA: Diagnosis not present

## 2018-07-01 DIAGNOSIS — G20 Parkinson's disease: Secondary | ICD-10-CM | POA: Diagnosis not present

## 2018-07-06 ENCOUNTER — Other Ambulatory Visit: Payer: Self-pay | Admitting: Family Medicine

## 2018-07-06 DIAGNOSIS — R262 Difficulty in walking, not elsewhere classified: Secondary | ICD-10-CM | POA: Diagnosis not present

## 2018-07-06 DIAGNOSIS — R278 Other lack of coordination: Secondary | ICD-10-CM | POA: Diagnosis not present

## 2018-07-06 DIAGNOSIS — G20 Parkinson's disease: Secondary | ICD-10-CM | POA: Diagnosis not present

## 2018-07-06 DIAGNOSIS — I10 Essential (primary) hypertension: Secondary | ICD-10-CM

## 2018-07-06 MED ORDER — METOPROLOL SUCCINATE ER 50 MG PO TB24: ORAL_TABLET | ORAL | 3 refills | Status: DC

## 2018-07-06 NOTE — Telephone Encounter (Signed)
Pt needing a refill on:  metoprolol succinate (TOPROL-XL) 50 MG 24 hr tablet - pt is out of RX  Please fill at:  [facility] [address] ([address]),  - [address] [phone] (Phone) [phone] (Fax)    Thanks, [employer]

## 2018-07-07 DIAGNOSIS — R278 Other lack of coordination: Secondary | ICD-10-CM | POA: Diagnosis not present

## 2018-07-07 DIAGNOSIS — R262 Difficulty in walking, not elsewhere classified: Secondary | ICD-10-CM | POA: Diagnosis not present

## 2018-07-07 DIAGNOSIS — G20 Parkinson's disease: Secondary | ICD-10-CM | POA: Diagnosis not present

## 2018-07-09 ENCOUNTER — Telehealth: Payer: Self-pay

## 2018-07-09 NOTE — Telephone Encounter (Signed)
Pt called complaining of increase dizziness, confusion, near syncope, and speech difficulty.  She was recently here for an office visit 06/25/2018 for the same symptoms.  Pt states the symptoms have worsened since then.  She states her BP's are better (140-150's/70's).    I advised the pt with her worsening symptoms she needed to be evaluated at the ER.  She refused to go because she got an $800 bill from them last time.  She stated she "just wanted some medicine to stop the dizziness". I asked to speak with Mr. Houston, she then transferred the phone to him.  He states ''she is not acting more confused and her speech is the same."  I advised him she should go to the ER but he also did not want to go secondary to cost.    I did make an appointment with you tomorrow at 2:20.  I reminded them if her symptoms become worse or if she has new symptoms to call 911 or go straight to the ER.  They both agreed.   Thanks,   -Vernona Rieger

## 2018-07-09 NOTE — Telephone Encounter (Signed)
Agree with plan 

## 2018-07-10 ENCOUNTER — Ambulatory Visit: Payer: Medicare HMO | Admitting: Family Medicine

## 2018-07-13 DIAGNOSIS — G20 Parkinson's disease: Secondary | ICD-10-CM | POA: Diagnosis not present

## 2018-07-13 DIAGNOSIS — R262 Difficulty in walking, not elsewhere classified: Secondary | ICD-10-CM | POA: Diagnosis not present

## 2018-07-13 DIAGNOSIS — R278 Other lack of coordination: Secondary | ICD-10-CM | POA: Diagnosis not present

## 2018-07-21 ENCOUNTER — Other Ambulatory Visit: Payer: Self-pay | Admitting: Family Medicine

## 2018-07-21 DIAGNOSIS — I10 Essential (primary) hypertension: Secondary | ICD-10-CM

## 2018-07-21 MED ORDER — AMLODIPINE BESYLATE 2.5 MG PO TABS: 2.5000 mg | ORAL_TABLET | Freq: Every day | ORAL | 3 refills | Status: DC

## 2018-07-21 NOTE — Telephone Encounter (Signed)
Pt needs a refill on her  Amlodpine 2.[address]  [address]  CB#  [phone]  Thanks  [name]

## 2018-07-23 DIAGNOSIS — R262 Difficulty in walking, not elsewhere classified: Secondary | ICD-10-CM | POA: Diagnosis not present

## 2018-07-23 DIAGNOSIS — G20 Parkinson's disease: Secondary | ICD-10-CM | POA: Diagnosis not present

## 2018-07-23 DIAGNOSIS — R278 Other lack of coordination: Secondary | ICD-10-CM | POA: Diagnosis not present

## 2018-07-28 DIAGNOSIS — G20 Parkinson's disease: Secondary | ICD-10-CM | POA: Diagnosis not present

## 2018-07-28 DIAGNOSIS — R262 Difficulty in walking, not elsewhere classified: Secondary | ICD-10-CM | POA: Diagnosis not present

## 2018-07-28 DIAGNOSIS — R278 Other lack of coordination: Secondary | ICD-10-CM | POA: Diagnosis not present

## 2018-07-31 ENCOUNTER — Telehealth: Payer: Self-pay | Admitting: Family Medicine

## 2018-07-31 NOTE — Telephone Encounter (Signed)
No answer. Left message to call back.   

## 2018-07-31 NOTE — Telephone Encounter (Signed)
Please advise 

## 2018-07-31 NOTE — Telephone Encounter (Signed)
Pt called confused about her medications.  She would like someone to call her back.  She does not know what to take and what not to take...  CB# [phone]  Thanks [name]

## 2018-08-04 DIAGNOSIS — R262 Difficulty in walking, not elsewhere classified: Secondary | ICD-10-CM | POA: Diagnosis not present

## 2018-08-04 DIAGNOSIS — R278 Other lack of coordination: Secondary | ICD-10-CM | POA: Diagnosis not present

## 2018-08-04 DIAGNOSIS — G20 Parkinson's disease: Secondary | ICD-10-CM | POA: Diagnosis not present

## 2018-08-11 DIAGNOSIS — G20 Parkinson's disease: Secondary | ICD-10-CM | POA: Diagnosis not present

## 2018-08-11 DIAGNOSIS — R278 Other lack of coordination: Secondary | ICD-10-CM | POA: Diagnosis not present

## 2018-08-11 DIAGNOSIS — R262 Difficulty in walking, not elsewhere classified: Secondary | ICD-10-CM | POA: Diagnosis not present

## 2018-08-18 ENCOUNTER — Telehealth: Payer: Self-pay

## 2018-08-18 NOTE — Telephone Encounter (Signed)
Called pt to r/s AWV that is scheduled for 09/11/18. I will be out of the office that day. LMTCB on direct #. -MM

## 2018-08-19 DIAGNOSIS — G20 Parkinson's disease: Secondary | ICD-10-CM | POA: Diagnosis not present

## 2018-08-19 DIAGNOSIS — I10 Essential (primary) hypertension: Secondary | ICD-10-CM | POA: Diagnosis not present

## 2018-08-19 DIAGNOSIS — R002 Palpitations: Secondary | ICD-10-CM | POA: Diagnosis not present

## 2018-08-19 DIAGNOSIS — K219 Gastro-esophageal reflux disease without esophagitis: Secondary | ICD-10-CM | POA: Diagnosis not present

## 2018-08-27 NOTE — Telephone Encounter (Signed)
Received VM this morning requesting a CB. CB and received pt's VM. LMTCB. -MM

## 2018-09-11 ENCOUNTER — Ambulatory Visit: Payer: Self-pay

## 2018-09-25 ENCOUNTER — Telehealth: Payer: Self-pay

## 2018-09-25 NOTE — Telephone Encounter (Signed)
Patient called and stated that she feels like she was about to pass out and her mouth was dry. I advised patient to go to the emergency room and she said not going to happen because they just want her money. Then patient stated that she does not drive and her husband would not take her to the ER so I advised patient if she feels like she is about to pass out she needs to call 911 so that EMS could transport her to the hospital and patient stated again its not going to happen and then hung up the phone. FYI

## 2018-09-28 NOTE — Telephone Encounter (Signed)
Patient states she if feeling well today. No further lightheaded sensation and dry mouth is a side effect of her Parkinson's medications. Advised to call or return as needed.

## 2018-10-09 NOTE — Telephone Encounter (Signed)
No CB- closing encounter. 

## 2018-10-21 ENCOUNTER — Other Ambulatory Visit: Payer: Self-pay | Admitting: Family Medicine

## 2018-10-21 DIAGNOSIS — I10 Essential (primary) hypertension: Secondary | ICD-10-CM

## 2018-10-21 MED ORDER — AMLODIPINE BESYLATE 2.5 MG PO TABS: 2.5000 mg | ORAL_TABLET | Freq: Every day | ORAL | 3 refills | Status: DC

## 2018-10-21 NOTE — Telephone Encounter (Signed)
[facility] faxed refill request for the following medications:  amLODipine (NORVASC) 2.5 MG tablet    Please advise. 

## 2018-11-02 DIAGNOSIS — G20 Parkinson's disease: Secondary | ICD-10-CM | POA: Diagnosis not present

## 2018-11-02 DIAGNOSIS — F0280 Dementia in other diseases classified elsewhere without behavioral disturbance: Secondary | ICD-10-CM | POA: Diagnosis not present

## 2018-11-02 DIAGNOSIS — R569 Unspecified convulsions: Secondary | ICD-10-CM | POA: Diagnosis not present

## 2018-11-02 DIAGNOSIS — R404 Transient alteration of awareness: Secondary | ICD-10-CM | POA: Diagnosis not present

## 2018-11-06 ENCOUNTER — Telehealth: Payer: Self-pay

## 2018-11-06 NOTE — Telephone Encounter (Signed)
Patient called saying that she is "having a fit" with her Parkinson's. She reports that her tremors are really bad that she can barely walk. She also is having shortness of breath and dry mouth. She is not in any distress, but it was recommended that she go to the ER to be evaluated.   Patient declined going to the ER due to not being able to afford the bill. I also offered to call EMS and she declined.  She reports that her husband was outside working in the yard and she was unable to get up out of the chair to let him know that she is feeling unwell. I asked the patient if he had a cell phone with him and she said no. Patient wanted to be seen in the office, but we had no available spots due to the office being closed.   The patient was very upset that she could not in, but it was recommended by the provider Maurine Minister) that she go to the ER to be evaluated or wait until Monday to be seen. Patient reports that she will try Urgent Care. Advised the patient to follow up with [place] after evaluation. She verbally understands.

## 2018-11-25 DIAGNOSIS — G20 Parkinson's disease: Secondary | ICD-10-CM | POA: Diagnosis not present

## 2018-11-25 DIAGNOSIS — F0280 Dementia in other diseases classified elsewhere without behavioral disturbance: Secondary | ICD-10-CM | POA: Diagnosis not present

## 2019-02-11 ENCOUNTER — Telehealth: Payer: Self-pay

## 2019-02-11 NOTE — Telephone Encounter (Signed)
LMTCB and see about scheduling a telephonic AWV. Pt had to cancel previous AWV due to Covid.

## 2019-02-18 ENCOUNTER — Other Ambulatory Visit: Payer: Self-pay | Admitting: Family Medicine

## 2019-02-18 DIAGNOSIS — I10 Essential (primary) hypertension: Secondary | ICD-10-CM

## 2019-05-31 ENCOUNTER — Other Ambulatory Visit: Payer: Self-pay | Admitting: Family Medicine

## 2019-05-31 DIAGNOSIS — I10 Essential (primary) hypertension: Secondary | ICD-10-CM

## 2019-05-31 NOTE — Telephone Encounter (Signed)
[facility] faxed refill request for the following medications:  amLODipine (NORVASC) 2.5 MG tablet  Please advise. Thanks, TGH 

## 2019-06-01 MED ORDER — AMLODIPINE BESYLATE 2.5 MG PO TABS: 2.5000 mg | ORAL_TABLET | Freq: Every day | ORAL | 0 refills | Status: DC

## 2019-07-09 ENCOUNTER — Other Ambulatory Visit: Payer: Self-pay | Admitting: Family Medicine

## 2019-07-09 DIAGNOSIS — I10 Essential (primary) hypertension: Secondary | ICD-10-CM

## 2019-07-09 NOTE — Telephone Encounter (Signed)
Requested medication (s) are due for refill today: yes  Requested medication (s) are on the active medication list: yes  Last refill:  12/29/2018  Future visit scheduled:no  Notes to clinic:  no valid encounter within last 6 months   Requested Prescriptions  Pending Prescriptions Disp Refills   metoprolol succinate (TOPROL-XL) 50 MG 24 hr tablet [Pharmacy Med Name: Metoprolol Succinate ER 50 MG Oral Tablet Extended Release 24 Hour] 90 tablet 0    Sig: TAKE 1 TABLET BY MOUTH ONCE DAILY. TAKE WITH OR IMMEDIATELY FOLLOWING A MEAL.      Cardiovascular:  Beta Blockers Failed - 07/09/2019  3:23 PM      Failed - Last BP in normal range    BP Readings from Last 1 Encounters:  06/25/18 (!) 178/70          Failed - Valid encounter within last 6 months    Recent Outpatient Visits           1 year ago Essential (primary) hypertension   [facility], Jodell Cipro, [place]   1 year ago Encounter for removal of sutures   [facility], Jodell Cipro, [place]   1 year ago Essential (primary) hypertension   [facility], Jodell Cipro, [place]   2 years ago Cough   [facility] Osvaldo Angst M, [place]   2 years ago Fall, initial encounter   [facility], [place], [place]              Passed - Last Heart Rate in normal range    Pulse Readings from Last 1 Encounters:  06/25/18 64

## 2019-08-06 ENCOUNTER — Other Ambulatory Visit: Payer: Self-pay

## 2019-08-06 ENCOUNTER — Telehealth: Payer: Self-pay

## 2019-08-06 ENCOUNTER — Ambulatory Visit (INDEPENDENT_AMBULATORY_CARE_PROVIDER_SITE_OTHER): Payer: Medicare Other | Admitting: Family Medicine

## 2019-08-06 ENCOUNTER — Encounter: Payer: Self-pay | Admitting: Family Medicine

## 2019-08-06 VITALS — BP 194/82 | HR 69 | Temp 96.5°F | Resp 16 | Ht 63.0 in | Wt 114.0 lb

## 2019-08-06 DIAGNOSIS — G20 Parkinson's disease: Secondary | ICD-10-CM | POA: Diagnosis not present

## 2019-08-06 DIAGNOSIS — I10 Essential (primary) hypertension: Secondary | ICD-10-CM

## 2019-08-06 NOTE — Progress Notes (Signed)
Patient: Destiny Torres Female    DOB: [birth year]   74 y.o.   MRN: [id number] Visit Date: 08/06/2019  Today's Provider: Vernie Murders, PA   Chief Complaint  Patient presents with  . Follow-up   Subjective:     HPI   Essential (primary) hypertension From 06/25/2018-Continues to take Metoprolol Succinate 50 mg qd. Added a little Amlodipine (2.5 mg qd) to the regular Metoprolol Succinate dosage and check routine labs. Labs were normal.  Parkinson's disease ([place]) From 06/25/2018-on Sinemet 25-250 mg one tablet 6 times a day. Labs normal.  Past Medical History:  Diagnosis Date  . Arthritis   . GERD (gastroesophageal reflux disease)   . Hypertension    Past Surgical History:  Procedure Laterality Date  . HAND SURGERY Right 04/2014  . TONSILLECTOMY     Family History  Problem Relation Age of Onset  . Heart disease Mother   . Arthritis Mother   . Heart disease Father   . Stroke Father    Allergies  Allergen Reactions  . Penicillins     patient unsure of reaction  . Streptomycin Rash    Current Outpatient Medications:  .  amLODipine (NORVASC) 2.5 MG tablet, Take 1 tablet (2.5 mg total) by mouth daily., Disp: 90 tablet, Rfl: 0 .  carbidopa-levodopa (SINEMET IR) 25-250 MG tablet, Take 1 tablet by mouth 6 (six) times daily. , Disp: , Rfl:  .  metoprolol succinate (TOPROL-XL) 50 MG 24 hr tablet, TAKE 1 TABLET BY MOUTH ONCE DAILY. TAKE WITH OR IMMEDIATELY FOLLOWING A MEAL., Disp: 30 tablet, Rfl: 0 .  aspirin 81 MG EC tablet, Take 1 tablet (81 mg total) by mouth daily. Swallow whole. (Patient not taking: Reported on 05/07/2018), Disp: 30 tablet, Rfl: 12 .  b complex vitamins capsule, Take by mouth., Disp: , Rfl:  .  cholecalciferol (VITAMIN D) 1000 units tablet, Take 1,000 Units by mouth daily., Disp: , Rfl:  .  famotidine (PEPCID) 40 MG tablet, Take 40 mg by mouth daily., Disp: , Rfl:  .  Magnesium 500 MG CAPS, Take 500 mg by mouth daily. , Disp: , Rfl:  .  Melatonin 5  MG CAPS, Take by mouth as needed., Disp: , Rfl:  .  vitamin B-12 (CYANOCOBALAMIN) 1000 MCG tablet, Take 1,000 mcg by mouth daily., Disp: , Rfl:  .  vitamin C (ASCORBIC ACID) 500 MG tablet, Take 500 mg by mouth daily. , Disp: , Rfl:  .  vitamin E 400 UNIT capsule, Take 400 Units by mouth daily. , Disp: , Rfl:   Review of Systems  Constitutional: Negative for appetite change, chills, fatigue and fever.  Respiratory: Negative for chest tightness and shortness of breath.   Cardiovascular: Negative for chest pain and palpitations.  Gastrointestinal: Negative for abdominal pain, nausea and vomiting.  Neurological: Negative for dizziness and weakness.    Social History   Tobacco Use  . Smoking status: Never Smoker  . Smokeless tobacco: Never Used  Substance Use Topics  . Alcohol use: No    Alcohol/week: 0.0 standard drinks      Objective:   BP (!) 191/84 (BP Location: Right Arm, Patient Position: Sitting, Cuff Size: Normal)   Pulse 70   Temp (!) 96.5 F (35.8 C) (Other (Comment))   Resp 16   Ht 5\' 3"  (1.6 m)   Wt 114 lb (51.7 kg)   SpO2 95%   BMI 20.19 kg/m  Vitals:   08/06/19 1020  BP: (!) 191/84  Pulse: 70  Resp: 16  Temp: (!) 96.5 F (35.8 C)  TempSrc: Other (Comment)  SpO2: 95%  Weight: 114 lb (51.7 kg)  Height: 5\' 3"  (1.6 m)  Body mass index is 20.19 kg/m.   Physical Exam Constitutional:      General: She is not in acute distress.    Appearance: She is well-developed.  HENT:     Head: Normocephalic and atraumatic.     Right Ear: Hearing normal.     Left Ear: Hearing normal.     Nose: Nose normal.  Eyes:     General: Lids are normal. No scleral icterus.       Right eye: No discharge.        Left eye: No discharge.     Conjunctiva/sclera: Conjunctivae normal.  Cardiovascular:     Rate and Rhythm: Normal rate and regular rhythm.  Pulmonary:     Effort: Pulmonary effort is normal. No respiratory distress.  Musculoskeletal:        General: Deformity  present. Normal range of motion.     Comments: Hand deformities/contractures bilaterally. Very little facial response today.  Skin:    Findings: No lesion or rash.  Neurological:     Mental Status: She is alert. She is disoriented.     Motor: Weakness present.  Psychiatric:        Speech: Speech normal.        Behavior: Behavior normal.        Thought Content: Thought content normal.       Assessment & Plan    1. Benign hypertension BP very high today. Husband is he primary caregiver. He noticed she has been very sleepy during the day and decided to cut back on her meds. Was on Amlodipine 2.5 mg qd with Metoprolol Succinate 50 mg qd. Will check routine labs to rule out infection, anemia, electrolyte imbalance and dehydration. Encouraged to drink extra fluid and schedule follow up pending reports. May need to increase the Amlodipine if no better after getting back on regular dosing of these meds. - CBC with Differential/Platelet - Comprehensive metabolic panel - TSH  2. Parkinson's disease ([facility]) Diagnosed in 2014 and usually has bilateral tremor in hands at rest. 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 Followed by Dr. 2015 (neurologist) and treated with Sinemet 25-250 mg 1 tablet 6 times a day and Keppra 500 mg BID. Presented in wheelchair today. Very quiet with little response to questions. Will get follow up labs and recommend regular follow up with neurologist. - CBC with Differential/Platelet - Comprehensive metabolic panel - TSH     Sherryll Burger, PA  [facility]

## 2019-08-06 NOTE — Telephone Encounter (Signed)
Copied from CRM [id number]. Topic: General - Other >> Aug 06, 2019  4:20 PM Gwenlyn Fudge wrote: Reason for CRM: Pts husband called stating that the pt has been taking levetiracetam 500mg , carbidopa- levodopa 25- 250mg  from her other doctor. Please advise.

## 2019-08-07 DIAGNOSIS — G8929 Other chronic pain: Secondary | ICD-10-CM | POA: Diagnosis not present

## 2019-08-07 DIAGNOSIS — R809 Proteinuria, unspecified: Secondary | ICD-10-CM | POA: Diagnosis not present

## 2019-08-07 DIAGNOSIS — R319 Hematuria, unspecified: Secondary | ICD-10-CM | POA: Diagnosis not present

## 2019-08-07 DIAGNOSIS — R0602 Shortness of breath: Secondary | ICD-10-CM | POA: Diagnosis not present

## 2019-08-07 DIAGNOSIS — Z880 Allergy status to penicillin: Secondary | ICD-10-CM | POA: Diagnosis not present

## 2019-08-07 DIAGNOSIS — R531 Weakness: Secondary | ICD-10-CM | POA: Diagnosis not present

## 2019-08-07 DIAGNOSIS — G20 Parkinson's disease: Secondary | ICD-10-CM | POA: Diagnosis not present

## 2019-08-07 DIAGNOSIS — M79641 Pain in right hand: Secondary | ICD-10-CM | POA: Diagnosis not present

## 2019-08-07 DIAGNOSIS — H538 Other visual disturbances: Secondary | ICD-10-CM | POA: Diagnosis not present

## 2019-08-07 DIAGNOSIS — I160 Hypertensive urgency: Secondary | ICD-10-CM | POA: Diagnosis not present

## 2019-08-07 DIAGNOSIS — Z7409 Other reduced mobility: Secondary | ICD-10-CM | POA: Diagnosis not present

## 2019-08-07 DIAGNOSIS — I10 Essential (primary) hypertension: Secondary | ICD-10-CM | POA: Diagnosis not present

## 2019-08-07 DIAGNOSIS — Z20822 Contact with and (suspected) exposure to covid-19: Secondary | ICD-10-CM | POA: Diagnosis not present

## 2019-08-07 DIAGNOSIS — M79642 Pain in left hand: Secondary | ICD-10-CM | POA: Diagnosis not present

## 2019-08-07 DIAGNOSIS — M6258 Muscle wasting and atrophy, not elsewhere classified, other site: Secondary | ICD-10-CM | POA: Diagnosis not present

## 2019-08-07 DIAGNOSIS — H9190 Unspecified hearing loss, unspecified ear: Secondary | ICD-10-CM | POA: Diagnosis not present

## 2019-08-07 DIAGNOSIS — G479 Sleep disorder, unspecified: Secondary | ICD-10-CM | POA: Diagnosis not present

## 2019-08-07 DIAGNOSIS — N179 Acute kidney failure, unspecified: Secondary | ICD-10-CM | POA: Diagnosis not present

## 2019-08-07 DIAGNOSIS — R5383 Other fatigue: Secondary | ICD-10-CM | POA: Diagnosis not present

## 2019-08-07 LAB — TSH: TSH: 3.98 u[IU]/mL (ref 0.450–4.500)

## 2019-08-07 LAB — COMPREHENSIVE METABOLIC PANEL
ALT: 7 IU/L (ref 0–32)
AST: 16 IU/L (ref 0–40)
Albumin/Globulin Ratio: 1.5 (ref 1.2–2.2)
Albumin: 3.8 g/dL (ref 3.7–4.7)
Alkaline Phosphatase: 144 IU/L — ABNORMAL HIGH (ref 39–117)
BUN/Creatinine Ratio: 17 (ref 12–28)
BUN: 16 mg/dL (ref 8–27)
Bilirubin Total: 0.4 mg/dL (ref 0.0–1.2)
CO2: 24 mmol/L (ref 20–29)
Calcium: 9.1 mg/dL (ref 8.7–10.3)
Chloride: 101 mmol/L (ref 96–106)
Creatinine, Ser: 0.93 mg/dL (ref 0.57–1.00)
GFR calc Af Amer: 70 mL/min/{1.73_m2} (ref >59)
GFR calc non Af Amer: 60 mL/min/{1.73_m2} (ref >59)
Globulin, Total: 2.6 g/dL (ref 1.5–4.5)
Glucose: 105 mg/dL — ABNORMAL HIGH (ref 65–99)
Potassium: 3.7 mmol/L (ref 3.5–5.2)
Sodium: 140 mmol/L (ref 134–144)
Total Protein: 6.4 g/dL (ref 6.0–8.5)

## 2019-08-07 LAB — CBC WITH DIFFERENTIAL/PLATELET
Basophils Absolute: 0.1 10*3/uL (ref 0.0–0.2)
Basos: 1 %
EOS (ABSOLUTE): 0.1 10*3/uL (ref 0.0–0.4)
Eos: 1 %
Hematocrit: 42.8 % (ref 34.0–46.6)
Hemoglobin: 14.5 g/dL (ref 11.1–15.9)
Immature Grans (Abs): 0.1 10*3/uL (ref 0.0–0.1)
Immature Granulocytes: 1 %
Lymphocytes Absolute: 2.3 10*3/uL (ref 0.7–3.1)
Lymphs: 25 %
MCH: 30.5 pg (ref 26.6–33.0)
MCHC: 33.9 g/dL (ref 31.5–35.7)
MCV: 90 fL (ref 79–97)
Monocytes Absolute: 0.6 10*3/uL (ref 0.1–0.9)
Monocytes: 7 %
Neutrophils Absolute: 5.9 10*3/uL (ref 1.4–7.0)
Neutrophils: 65 %
Platelets: 304 10*3/uL (ref 150–450)
RBC: 4.75 x10E6/uL (ref 3.77–5.28)
RDW: 12.5 % (ref 11.7–15.4)
WBC: 9 10*3/uL (ref 3.4–10.8)

## 2019-08-08 DIAGNOSIS — R0602 Shortness of breath: Secondary | ICD-10-CM | POA: Diagnosis not present

## 2019-08-08 DIAGNOSIS — R627 Adult failure to thrive: Secondary | ICD-10-CM | POA: Diagnosis not present

## 2019-08-08 DIAGNOSIS — N179 Acute kidney failure, unspecified: Secondary | ICD-10-CM | POA: Diagnosis not present

## 2019-08-08 DIAGNOSIS — I10 Essential (primary) hypertension: Secondary | ICD-10-CM | POA: Diagnosis not present

## 2019-08-08 DIAGNOSIS — N2889 Other specified disorders of kidney and ureter: Secondary | ICD-10-CM | POA: Diagnosis not present

## 2019-08-08 DIAGNOSIS — R918 Other nonspecific abnormal finding of lung field: Secondary | ICD-10-CM | POA: Diagnosis not present

## 2019-08-08 DIAGNOSIS — I160 Hypertensive urgency: Secondary | ICD-10-CM | POA: Diagnosis not present

## 2019-08-09 ENCOUNTER — Other Ambulatory Visit: Payer: Self-pay

## 2019-08-09 ENCOUNTER — Telehealth: Payer: Self-pay

## 2019-08-09 DIAGNOSIS — R627 Adult failure to thrive: Secondary | ICD-10-CM | POA: Diagnosis not present

## 2019-08-09 DIAGNOSIS — N179 Acute kidney failure, unspecified: Secondary | ICD-10-CM | POA: Diagnosis not present

## 2019-08-09 DIAGNOSIS — I160 Hypertensive urgency: Secondary | ICD-10-CM | POA: Diagnosis not present

## 2019-08-09 DIAGNOSIS — I10 Essential (primary) hypertension: Secondary | ICD-10-CM | POA: Diagnosis not present

## 2019-08-09 DIAGNOSIS — R748 Abnormal levels of other serum enzymes: Secondary | ICD-10-CM

## 2019-08-09 MED ORDER — ENOXAPARIN SODIUM 40 MG/0.4ML ~~LOC~~ SOLN
40.00 | SUBCUTANEOUS | Status: DC
Start: 2019-08-09 — End: 2019-08-09

## 2019-08-09 MED ORDER — CARBIDOPA-LEVODOPA 25-250 MG PO TABS
1.00 | ORAL_TABLET | ORAL | Status: DC
Start: 2019-08-09 — End: 2019-08-09

## 2019-08-09 MED ORDER — METOPROLOL SUCCINATE ER 50 MG PO TB24
50.00 | ORAL_TABLET | ORAL | Status: DC
Start: 2019-08-09 — End: 2019-08-09

## 2019-08-09 MED ORDER — AMLODIPINE BESYLATE 10 MG PO TABS
10.00 | ORAL_TABLET | ORAL | Status: DC
Start: 2019-08-09 — End: 2019-08-09

## 2019-08-09 NOTE — Telephone Encounter (Signed)
Husband was trying to let us know the two meds her neurologist had her on, for documentation in the chart (Keppra 500 mg BID and Sinemet 25-250 mg 1 tablet 6 times a day). Thanks!

## 2019-08-09 NOTE — Telephone Encounter (Signed)
LMTCB, [facility] Triage Nurse may give patient results Order for future test has been placed.

## 2019-08-09 NOTE — Telephone Encounter (Signed)
-----   Message from Tamsen Roers, [place] sent at 08/09/2019  8:22 AM EST ----- All blood tests essentially normal except alkaline phosphatase high. No other liver enzymes are elevated. This could be related to her arthritic condition. Should drink extra water in diet and recheck level in 4-6 weeks.

## 2019-08-10 ENCOUNTER — Other Ambulatory Visit: Payer: Self-pay

## 2019-08-13 ENCOUNTER — Encounter: Payer: Self-pay | Admitting: Family Medicine

## 2019-08-13 ENCOUNTER — Ambulatory Visit (INDEPENDENT_AMBULATORY_CARE_PROVIDER_SITE_OTHER): Payer: Medicare Other | Admitting: Family Medicine

## 2019-08-13 ENCOUNTER — Other Ambulatory Visit: Payer: Self-pay

## 2019-08-13 VITALS — BP 144/74 | HR 86 | Temp 96.2°F | Wt 112.2 lb

## 2019-08-13 DIAGNOSIS — N179 Acute kidney failure, unspecified: Secondary | ICD-10-CM

## 2019-08-13 DIAGNOSIS — G20 Parkinson's disease: Secondary | ICD-10-CM

## 2019-08-13 DIAGNOSIS — I10 Essential (primary) hypertension: Secondary | ICD-10-CM

## 2019-08-13 NOTE — Progress Notes (Signed)
Patient: Destiny Torres Female    DOB: [birth year]   74 y.o.   MRN: [id number] Visit Date: 08/13/2019  Today's Provider: Dortha Kern, PA   No chief complaint on file.  Subjective:     HPI    Follow up Hospitalization  Patient was admitted to [facility] on 08/07/2019 and discharged on 08/09/2019. She was treated for AKII. Treatment for this included see note in chart.. Telephone follow up was done on n/a She reports fair compliance with treatment. She reports this condition is Improved.  -----------------------------------------------------------   Patient states she has stopped her supplements. Patient feels somewhat better since hospital discharge.  Past Medical History:  Diagnosis Date  . Arthritis   . GERD (gastroesophageal reflux disease)   . Hypertension    Past Surgical History:  Procedure Laterality Date  . HAND SURGERY Right 04/2014  . TONSILLECTOMY     Family History  Problem Relation Age of Onset  . Heart disease Mother   . Arthritis Mother   . Heart disease Father   . Stroke Father    Allergies  Allergen Reactions  . Penicillins     patient unsure of reaction  . Streptomycin Rash    Current Outpatient Medications:  .  amLODipine (NORVASC) 10 MG tablet, Take 10 mg by mouth daily., Disp: , Rfl:  .  amLODipine (NORVASC) 2.5 MG tablet, Take 1 tablet (2.5 mg total) by mouth daily. (Patient not taking: Reported on 08/13/2019), Disp: 90 tablet, Rfl: 0 .  aspirin 81 MG EC tablet, Take 1 tablet (81 mg total) by mouth daily. Swallow whole. (Patient not taking: Reported on 05/07/2018), Disp: 30 tablet, Rfl: 12 .  b complex vitamins capsule, Take by mouth., Disp: , Rfl:  .  carbidopa-levodopa (SINEMET IR) 25-250 MG tablet, Take 1 tablet by mouth 6 (six) times daily. , Disp: , Rfl:  .  cholecalciferol (VITAMIN D) 1000 units tablet, Take 1,000 Units by mouth daily., Disp: , Rfl:  .  famotidine (PEPCID) 40 MG tablet, Take 40 mg by mouth daily., Disp: , Rfl:  .   levETIRAcetam (KEPPRA) 500 MG tablet, Take 500 mg by mouth 2 (two) times daily., Disp: , Rfl:  .  Magnesium 500 MG CAPS, Take 500 mg by mouth daily. , Disp: , Rfl:  .  Melatonin 5 MG CAPS, Take by mouth as needed., Disp: , Rfl:  .  metoprolol succinate (TOPROL-XL) 50 MG 24 hr tablet, TAKE 1 TABLET BY MOUTH ONCE DAILY. TAKE WITH OR IMMEDIATELY FOLLOWING A MEAL. (Patient not taking: Reported on 08/13/2019), Disp: 30 tablet, Rfl: 0 .  vitamin B-12 (CYANOCOBALAMIN) 1000 MCG tablet, Take 1,000 mcg by mouth daily., Disp: , Rfl:  .  vitamin C (ASCORBIC ACID) 500 MG tablet, Take 500 mg by mouth daily. , Disp: , Rfl:  .  vitamin E 400 UNIT capsule, Take 400 Units by mouth daily. , Disp: , Rfl:   Review of Systems  Constitutional: Negative for appetite change, chills, fatigue and fever.  Respiratory: Negative for chest tightness and shortness of breath.   Cardiovascular: Negative for chest pain and palpitations.  Gastrointestinal: Negative for abdominal pain, nausea and vomiting.  Neurological: Negative for dizziness and weakness.    Social History   Tobacco Use  . Smoking status: Never Smoker  . Smokeless tobacco: Never Used  Substance Use Topics  . Alcohol use: No    Alcohol/week: 0.0 standard drinks     Objective:   BP (!) 144/74 (BP Location:  Right Arm, Patient Position: Sitting, Cuff Size: Normal)   Pulse 86   Temp (!) 96.2 F (35.7 C) (Temporal)   Wt 112 lb 3.2 oz (50.9 kg)   SpO2 100%   BMI 19.88 kg/m  Vitals:   08/13/19 1120  BP: (!) 144/74  Pulse: 86  Temp: (!) 96.2 F (35.7 C)  TempSrc: Temporal  SpO2: 100%  Weight: 112 lb 3.2 oz (50.9 kg)  Body mass index is 19.88 kg/m. Wt Readings from Last 3 Encounters:  08/13/19 112 lb 3.2 oz (50.9 kg)  08/06/19 114 lb (51.7 kg)  06/25/18 115 lb 9.6 oz (52.4 kg)   Physical Exam Constitutional:      General: She is not in acute distress.    Appearance: She is well-developed.  HENT:     Head: Normocephalic and atraumatic.      Right Ear: Hearing normal.     Left Ear: Hearing normal.     Nose: Nose normal.  Eyes:     General: Lids are normal. No scleral icterus.       Right eye: No discharge.        Left eye: No discharge.     Conjunctiva/sclera: Conjunctivae normal.  Pulmonary:     Effort: Pulmonary effort is normal. No respiratory distress.  Musculoskeletal:        General: Normal range of motion.  Skin:    Findings: No lesion or rash.  Neurological:     Mental Status: She is alert and oriented to person, place, and time.  Psychiatric:        Speech: Speech normal.        Behavior: Behavior normal.        Thought Content: Thought content normal.       Assessment & Plan    1. AKI (acute kidney injury) [facility] Husband states she asked to be take for medical evaluation on Saturday 08-07-19 because she did not feel well. Was found to have hematuria without infection, Creatinine 1.43 with BUN up to 25 in the ER. After increasing the Amlodipine to get BP under control and IV hydration, levels corrected and she is feeling improved. Discharged on 08-09-19. Recheck CBC and CMP. Encouraged to continue to drink extra water and eat 3 meals a day. Follow up pending lab reports. - CBC with Differential/Platelet - Comprehensive metabolic panel  2. Essential (primary) hypertension Much improved with the increase of Amlodipine to 10 mg qd and continuing the Metoprolol Succinate 50 mg qd. No complaints today. Will check follow up labs. - CBC with Differential/Platelet - Comprehensive metabolic panel  3. Parkinson's disease ([place]) Diagnosed in 2014 without behavioral disturbance. Stone faces today but cooperative and walking (shuffling gait) without wheelchair today. Still taking Sinemet 25-250 mg 6 times a day. Seems more alert since stopping the Keppra and correction of AKI. Discussed need for DNR with dementia. Husband wanted to consider this and do it at a later follow up time. Will get follow up lab reports to decide on  follow up time. - CBC with Differential/Platelet - Comprehensive metabolic panel     Vernie Murders, PA  [facility]

## 2019-08-13 NOTE — Telephone Encounter (Signed)
Patient was notified of results.  

## 2019-08-15 ENCOUNTER — Other Ambulatory Visit: Payer: Self-pay | Admitting: Family Medicine

## 2019-08-15 DIAGNOSIS — I10 Essential (primary) hypertension: Secondary | ICD-10-CM

## 2019-08-15 NOTE — Telephone Encounter (Signed)
Requested Prescriptions  Pending Prescriptions Disp Refills  . metoprolol succinate (TOPROL-XL) 50 MG 24 hr tablet [Pharmacy Med Name: Metoprolol Succinate ER 50 MG Oral Tablet Extended Release 24 Hour] 90 tablet 1    Sig: TAKE 1 TABLET BY MOUTH ONCE DAILY. TAKE WITH OR IMMEDIAELY FOLLOWING A MEAL     Cardiovascular:  Beta Blockers Failed - 08/15/2019 12:07 PM      Failed - Last BP in normal range    BP Readings from Last 1 Encounters:  08/13/19 (!) 144/74         Passed - Last Heart Rate in normal range    Pulse Readings from Last 1 Encounters:  08/13/19 86         Passed - Valid encounter within last 6 months    Recent Outpatient Visits          2 days ago AKI (acute kidney injury) [facility])   [facility] [place], Jodell Cipro, [place]   1 week ago Benign hypertension   [facility], Jodell Cipro, [place]   1 year ago Essential (primary) hypertension   [facility], Jodell Cipro, [place]   1 year ago Encounter for removal of sutures   [facility], Jodell Cipro, [place]   1 year ago Essential (primary) hypertension   [facility], Jodell Cipro, [place]

## 2019-08-16 ENCOUNTER — Inpatient Hospital Stay: Payer: Medicare Other | Admitting: Family Medicine

## 2019-08-16 DIAGNOSIS — N179 Acute kidney failure, unspecified: Secondary | ICD-10-CM | POA: Diagnosis not present

## 2019-08-16 DIAGNOSIS — G20 Parkinson's disease: Secondary | ICD-10-CM | POA: Diagnosis not present

## 2019-08-16 DIAGNOSIS — I10 Essential (primary) hypertension: Secondary | ICD-10-CM | POA: Diagnosis not present

## 2019-08-17 ENCOUNTER — Telehealth: Payer: Self-pay

## 2019-08-17 LAB — CBC WITH DIFFERENTIAL/PLATELET
Basophils Absolute: 0 10*3/uL (ref 0.0–0.2)
Basos: 0 %
EOS (ABSOLUTE): 0.1 10*3/uL (ref 0.0–0.4)
Eos: 1 %
Hematocrit: 41.4 % (ref 34.0–46.6)
Hemoglobin: 14.6 g/dL (ref 11.1–15.9)
Immature Grans (Abs): 0.2 10*3/uL — ABNORMAL HIGH (ref 0.0–0.1)
Immature Granulocytes: 2 %
Lymphocytes Absolute: 2.2 10*3/uL (ref 0.7–3.1)
Lymphs: 23 %
MCH: 30.4 pg (ref 26.6–33.0)
MCHC: 35.3 g/dL (ref 31.5–35.7)
MCV: 86 fL (ref 79–97)
Monocytes Absolute: 0.7 10*3/uL (ref 0.1–0.9)
Monocytes: 8 %
Neutrophils Absolute: 6.4 10*3/uL (ref 1.4–7.0)
Neutrophils: 66 %
Platelets: 349 10*3/uL (ref 150–450)
RBC: 4.8 x10E6/uL (ref 3.77–5.28)
RDW: 12.2 % (ref 11.7–15.4)
WBC: 9.6 10*3/uL (ref 3.4–10.8)

## 2019-08-17 LAB — COMPREHENSIVE METABOLIC PANEL
ALT: 4 IU/L (ref 0–32)
AST: 16 IU/L (ref 0–40)
Albumin/Globulin Ratio: 1.4 (ref 1.2–2.2)
Albumin: 3.9 g/dL (ref 3.7–4.7)
Alkaline Phosphatase: 174 IU/L — ABNORMAL HIGH (ref 39–117)
BUN/Creatinine Ratio: 22 (ref 12–28)
BUN: 23 mg/dL (ref 8–27)
Bilirubin Total: 0.5 mg/dL (ref 0.0–1.2)
CO2: 23 mmol/L (ref 20–29)
Calcium: 9.1 mg/dL (ref 8.7–10.3)
Chloride: 101 mmol/L (ref 96–106)
Creatinine, Ser: 1.06 mg/dL — ABNORMAL HIGH (ref 0.57–1.00)
GFR calc Af Amer: 59 mL/min/{1.73_m2} — ABNORMAL LOW (ref >59)
GFR calc non Af Amer: 51 mL/min/{1.73_m2} — ABNORMAL LOW (ref >59)
Globulin, Total: 2.7 g/dL (ref 1.5–4.5)
Glucose: 93 mg/dL (ref 65–99)
Potassium: 4.2 mmol/L (ref 3.5–5.2)
Sodium: 138 mmol/L (ref 134–144)
Total Protein: 6.6 g/dL (ref 6.0–8.5)

## 2019-08-17 NOTE — Telephone Encounter (Signed)
Destiny Torres, isn't this handled by Neurology, should I ask them to call there?  I see in your note it says she is continuing the med 6 times daily.   Also her cousin is not on the patient's DPR.   Copied from CRM [id number]. Topic: General - Other >> Aug 17, 2019  9:10 AM Destiny Torres wrote: Reason for CRM: Patient cousin Destiny Torres  called to find out exactly which medication the patient should be taking and how often especially the carbidopa-levodopa (SINEMET IR) 25-250 MG tablet bottle reads 1 tab po 6 times daily. Asking for clarification and an updated medication list. Son Destiny Torres need to know so that they can get Torres handle on her daily dosage also if patient should be taking the carbidopa-levodopa (SINEMET IR) 25-250 MG tablet they need Torres new Rx sent to the pharmacy. Please call Destiny Torres at Ph# [phone]

## 2019-08-17 NOTE — Telephone Encounter (Signed)
Will wait for DPR to call

## 2019-08-17 NOTE — Telephone Encounter (Signed)
Advised Destiny Torres that she was not on the patient's DPR and therefor I could not discuss anything about the patient with her. Advised that if she would have the husband or son of the patient call we could discuss her care with one of them.

## 2019-08-17 NOTE — Telephone Encounter (Signed)
-----   Message from Jodell Cipro [place], [place] sent at 08/17/2019  8:19 AM EDT ----- Blood tests show a little elevation of creatinine which should be better if she gets more water in her diet. Alkaline phosphatase is higher. This may be associated with liver stress from metabolizing all her medications and having severe arthritis. Recheck these levels in 3 months to assess progress.

## 2019-08-17 NOTE — Telephone Encounter (Signed)
Husband is primary caregiver and they have refused home health visits offered by her neurologist in the past. Dr. Sherryll Burger is her neurologist and prescribes the Sinemet for her Parkinson's Disease.

## 2019-08-17 NOTE — Telephone Encounter (Signed)
LMTCB, [facility] Triage Nurse may give patient results  

## 2019-08-17 NOTE — Telephone Encounter (Signed)
Attempted to contact patient with lab results. Left VM to return call to office.

## 2019-08-25 DIAGNOSIS — S0083XA Contusion of other part of head, initial encounter: Secondary | ICD-10-CM | POA: Diagnosis not present

## 2019-08-25 DIAGNOSIS — S299XXA Unspecified injury of thorax, initial encounter: Secondary | ICD-10-CM | POA: Diagnosis not present

## 2019-08-25 DIAGNOSIS — Z043 Encounter for examination and observation following other accident: Secondary | ICD-10-CM | POA: Diagnosis not present

## 2019-08-25 DIAGNOSIS — Z880 Allergy status to penicillin: Secondary | ICD-10-CM | POA: Diagnosis not present

## 2019-08-25 DIAGNOSIS — S0990XA Unspecified injury of head, initial encounter: Secondary | ICD-10-CM | POA: Diagnosis not present

## 2019-08-25 DIAGNOSIS — G20 Parkinson's disease: Secondary | ICD-10-CM | POA: Diagnosis not present

## 2019-08-25 DIAGNOSIS — W19XXXA Unspecified fall, initial encounter: Secondary | ICD-10-CM | POA: Diagnosis not present

## 2019-08-31 DIAGNOSIS — I6389 Other cerebral infarction: Secondary | ICD-10-CM | POA: Diagnosis not present

## 2019-08-31 DIAGNOSIS — S0101XA Laceration without foreign body of scalp, initial encounter: Secondary | ICD-10-CM | POA: Diagnosis not present

## 2019-08-31 DIAGNOSIS — S0191XA Laceration without foreign body of unspecified part of head, initial encounter: Secondary | ICD-10-CM | POA: Diagnosis not present

## 2019-08-31 DIAGNOSIS — S61216A Laceration without foreign body of right little finger without damage to nail, initial encounter: Secondary | ICD-10-CM | POA: Diagnosis not present

## 2019-08-31 DIAGNOSIS — G20 Parkinson's disease: Secondary | ICD-10-CM | POA: Diagnosis not present

## 2019-08-31 DIAGNOSIS — B9689 Other specified bacterial agents as the cause of diseases classified elsewhere: Secondary | ICD-10-CM | POA: Diagnosis not present

## 2019-08-31 DIAGNOSIS — S62616A Displaced fracture of proximal phalanx of right little finger, initial encounter for closed fracture: Secondary | ICD-10-CM | POA: Diagnosis not present

## 2019-08-31 DIAGNOSIS — N390 Urinary tract infection, site not specified: Secondary | ICD-10-CM | POA: Diagnosis not present

## 2019-08-31 DIAGNOSIS — S199XXA Unspecified injury of neck, initial encounter: Secondary | ICD-10-CM | POA: Diagnosis not present

## 2019-08-31 DIAGNOSIS — S299XXA Unspecified injury of thorax, initial encounter: Secondary | ICD-10-CM | POA: Diagnosis not present

## 2019-08-31 DIAGNOSIS — Z880 Allergy status to penicillin: Secondary | ICD-10-CM | POA: Diagnosis not present

## 2019-09-07 DIAGNOSIS — Z23 Encounter for immunization: Secondary | ICD-10-CM | POA: Diagnosis not present

## 2019-09-07 DIAGNOSIS — Z880 Allergy status to penicillin: Secondary | ICD-10-CM | POA: Diagnosis not present

## 2019-09-07 DIAGNOSIS — S61216D Laceration without foreign body of right little finger without damage to nail, subsequent encounter: Secondary | ICD-10-CM | POA: Diagnosis not present

## 2019-09-07 DIAGNOSIS — S0101XD Laceration without foreign body of scalp, subsequent encounter: Secondary | ICD-10-CM | POA: Diagnosis not present

## 2019-09-07 DIAGNOSIS — G20 Parkinson's disease: Secondary | ICD-10-CM | POA: Diagnosis not present

## 2019-09-07 DIAGNOSIS — I10 Essential (primary) hypertension: Secondary | ICD-10-CM | POA: Diagnosis not present

## 2019-09-07 DIAGNOSIS — Z4802 Encounter for removal of sutures: Secondary | ICD-10-CM | POA: Diagnosis not present

## 2019-10-02 DIAGNOSIS — R778 Other specified abnormalities of plasma proteins: Secondary | ICD-10-CM | POA: Diagnosis not present

## 2019-10-02 DIAGNOSIS — R9082 White matter disease, unspecified: Secondary | ICD-10-CM | POA: Diagnosis not present

## 2019-10-02 DIAGNOSIS — R569 Unspecified convulsions: Secondary | ICD-10-CM | POA: Diagnosis not present

## 2019-10-02 DIAGNOSIS — Z880 Allergy status to penicillin: Secondary | ICD-10-CM | POA: Diagnosis not present

## 2019-10-02 DIAGNOSIS — R404 Transient alteration of awareness: Secondary | ICD-10-CM | POA: Diagnosis not present

## 2019-10-02 DIAGNOSIS — J189 Pneumonia, unspecified organism: Secondary | ICD-10-CM | POA: Diagnosis not present

## 2019-10-02 DIAGNOSIS — S63285A Dislocation of proximal interphalangeal joint of left ring finger, initial encounter: Secondary | ICD-10-CM | POA: Diagnosis not present

## 2019-10-02 DIAGNOSIS — R259 Unspecified abnormal involuntary movements: Secondary | ICD-10-CM | POA: Diagnosis not present

## 2019-10-02 DIAGNOSIS — S0990XA Unspecified injury of head, initial encounter: Secondary | ICD-10-CM | POA: Diagnosis not present

## 2019-10-02 DIAGNOSIS — I951 Orthostatic hypotension: Secondary | ICD-10-CM | POA: Diagnosis not present

## 2019-10-02 DIAGNOSIS — T378X5A Adverse effect of other specified systemic anti-infectives and antiparasitics, initial encounter: Secondary | ICD-10-CM | POA: Diagnosis not present

## 2019-10-02 DIAGNOSIS — S299XXA Unspecified injury of thorax, initial encounter: Secondary | ICD-10-CM | POA: Diagnosis not present

## 2019-10-02 DIAGNOSIS — M79672 Pain in left foot: Secondary | ICD-10-CM | POA: Diagnosis not present

## 2019-10-02 DIAGNOSIS — I517 Cardiomegaly: Secondary | ICD-10-CM | POA: Diagnosis not present

## 2019-10-02 DIAGNOSIS — Z20822 Contact with and (suspected) exposure to covid-19: Secondary | ICD-10-CM | POA: Diagnosis not present

## 2019-10-02 DIAGNOSIS — Z01810 Encounter for preprocedural cardiovascular examination: Secondary | ICD-10-CM | POA: Diagnosis not present

## 2019-10-02 DIAGNOSIS — G20 Parkinson's disease: Secondary | ICD-10-CM | POA: Diagnosis not present

## 2019-10-02 DIAGNOSIS — R296 Repeated falls: Secondary | ICD-10-CM | POA: Diagnosis not present

## 2019-10-02 DIAGNOSIS — I10 Essential (primary) hypertension: Secondary | ICD-10-CM | POA: Diagnosis not present

## 2019-10-02 DIAGNOSIS — W19XXXA Unspecified fall, initial encounter: Secondary | ICD-10-CM | POA: Diagnosis not present

## 2019-10-02 DIAGNOSIS — G92 Toxic encephalopathy: Secondary | ICD-10-CM | POA: Diagnosis not present

## 2019-10-02 DIAGNOSIS — S199XXA Unspecified injury of neck, initial encounter: Secondary | ICD-10-CM | POA: Diagnosis not present

## 2019-10-02 DIAGNOSIS — M25512 Pain in left shoulder: Secondary | ICD-10-CM | POA: Diagnosis not present

## 2019-10-02 DIAGNOSIS — S63283A Dislocation of proximal interphalangeal joint of left middle finger, initial encounter: Secondary | ICD-10-CM | POA: Diagnosis not present

## 2019-10-02 DIAGNOSIS — N390 Urinary tract infection, site not specified: Secondary | ICD-10-CM | POA: Diagnosis not present

## 2019-10-02 DIAGNOSIS — Z9181 History of falling: Secondary | ICD-10-CM | POA: Diagnosis not present

## 2019-10-02 DIAGNOSIS — Z79899 Other long term (current) drug therapy: Secondary | ICD-10-CM | POA: Diagnosis not present

## 2019-10-02 DIAGNOSIS — R079 Chest pain, unspecified: Secondary | ICD-10-CM | POA: Diagnosis not present

## 2019-10-02 DIAGNOSIS — G9340 Encephalopathy, unspecified: Secondary | ICD-10-CM | POA: Diagnosis not present

## 2019-10-02 DIAGNOSIS — J690 Pneumonitis due to inhalation of food and vomit: Secondary | ICD-10-CM | POA: Diagnosis not present

## 2019-10-02 DIAGNOSIS — I499 Cardiac arrhythmia, unspecified: Secondary | ICD-10-CM | POA: Diagnosis not present

## 2019-10-02 DIAGNOSIS — E440 Moderate protein-calorie malnutrition: Secondary | ICD-10-CM | POA: Diagnosis not present

## 2019-10-02 DIAGNOSIS — S63235A Subluxation of proximal interphalangeal joint of left ring finger, initial encounter: Secondary | ICD-10-CM | POA: Diagnosis not present

## 2019-10-02 DIAGNOSIS — S63253A Unspecified dislocation of left middle finger, initial encounter: Secondary | ICD-10-CM | POA: Diagnosis not present

## 2019-10-11 ENCOUNTER — Telehealth: Payer: Self-pay | Admitting: Family Medicine

## 2019-10-11 NOTE — Telephone Encounter (Signed)
Left message for patient to schedule Annual Wellness Visit. .Please schedule with Nurse Health Advisor McKenzie Markoski, RN. ° °

## 2019-10-18 NOTE — Progress Notes (Signed)
I,Joseline E Rosas,acting as a Neurosurgeon for [employer], [employer].,have documented all relevant documentation on the behalf of [employer],as directed by  [employer] while in the presence of [employer], [place]. Established patient visit   Patient: Destiny Torres   DOB: 01-07-1946   74 y.o. Female  MRN: [id number] Visit Date: 10/19/2019  Today's healthcare provider: Dortha Kern, PA   Chief Complaint  Patient presents with  . Suture / Staple Removal   Subjective    HPI  The patient is a 74 year old female who presents today to have sutures removed.  The patient was out of town when she had a fall,  which subsequently resulted in her having surgery on her finger.   Hypertension: She is currently on Amlodipine 10mg  and Metoprolol Succinate 50 mg qd. No complaints today. BP Readings from Last 3 Encounters:  10/19/19 139/77  08/13/19 (!) 144/74  08/06/19 (!) 194/82   Parkinson's Disease: She still taking Sinemet.  She is also due for labs recheck. Last labs showed a little elevation of creatinine. Alkaline phosphatase was higher.   Urine was also checked at ED and was abnormal.  Tendons in her hands have always been stiff from the arthritis.  Medications: Outpatient Medications Prior to Visit  Medication Sig  . amLODipine (NORVASC) 10 MG tablet Take 10 mg by mouth daily.  . carbidopa-levodopa (SINEMET IR) 25-250 MG tablet Take 1 tablet by mouth 6 (six) times daily.   . metoprolol succinate (TOPROL-XL) 50 MG 24 hr tablet TAKE 1 TABLET BY MOUTH ONCE DAILY. TAKE WITH OR IMMEDIAELY FOLLOWING A MEAL  . aspirin 81 MG EC tablet Take 1 tablet (81 mg total) by mouth daily. Swallow whole. (Patient not taking: Reported on 05/07/2018)  . b complex vitamins capsule Take by mouth.  . cholecalciferol (VITAMIN D) 1000 units tablet Take 1,000 Units by mouth daily.  . famotidine (PEPCID) 40 MG tablet Take 40 mg by mouth daily.  . Magnesium 500 MG CAPS Take 500 mg by mouth  daily.   . Melatonin 5 MG CAPS Take by mouth as needed.  . vitamin B-12 (CYANOCOBALAMIN) 1000 MCG tablet Take 1,000 mcg by mouth daily.  . vitamin C (ASCORBIC ACID) 500 MG tablet Take 500 mg by mouth daily.   . vitamin E 400 UNIT capsule Take 400 Units by mouth daily.    No facility-administered medications prior to visit.    Review of Systems  Cardiovascular: Negative for chest pain, palpitations and leg swelling.      Objective    BP 139/77 (BP Location: Right Arm, Patient Position: Sitting, Cuff Size: Small)   Pulse 71   Temp (!) 97 F (36.1 C) (Temporal)   Resp 16   Wt 107 lb 9.6 oz (48.8 kg)   BMI 19.06 kg/m   Physical Exam Constitutional:      General: She is not in acute distress.    Appearance: She is normal weight. She is not ill-appearing.  HENT:     Right Ear: Tympanic membrane, ear canal and external ear normal. There is no impacted cerumen.     Left Ear: Tympanic membrane, ear canal and external ear normal. There is no impacted cerumen.  Eyes:     General: No scleral icterus.    Extraocular Movements: Extraocular movements intact.     Conjunctiva/sclera: Conjunctivae normal.     Pupils: Pupils are equal, round, and reactive to light.     Comments: Temple scrape over the  right eye looks good.   Cardiovascular:     Rate and Rhythm: Normal rate.     Heart sounds: Normal heart sounds.  Pulmonary:     Effort: Pulmonary effort is normal.     Breath sounds: Normal breath sounds. No wheezing.  Abdominal:     General: Abdomen is flat.     Palpations: Abdomen is soft.  Musculoskeletal:        General: Deformity present.     Cervical back: Normal range of motion and neck supple.     Left upper leg: Normal. No swelling or edema.     Right lower leg: No edema.     Left lower leg: No swelling. No edema.     Comments: Suture removed from left middle finger and left index finger from surgical fixation of fracture. All fingers with deformities and contractures  limiting ability to use hands.  Neurological:     Mental Status: She is alert. Mental status is at baseline.     Motor: Weakness present.     Gait: Gait abnormal.     Deep Tendon Reflexes:     Reflex Scores:      Achilles reflexes are 2+ on the right side.    Comments: Very little facial expression but responds to questions "stone facies". Very poor balance and shuffling gait at home using cane. Has to use wheelchair when she leaves the home.     No results found for any visits on 10/19/19.  Assessment & Plan    1. Encounter for removal of sutures Lacerations healing well secondary to a fall with history of Parkinson's Disease. Left side of forehead, 3 stitches removed, 4 stitches from left middle finger,2 stitches removed from left index finger. Still has deformities of all fingers limiting ability to grasp or pick up most things. Able to use cane at home for ambulation and balance. Tdap was Updated on 09/07/2019 at the [facility] ED  2. Essential (primary) hypertension Stable. Continue current medical treatment plan.Will check labs as below and f/u pending results. - CBC with Differential/Platelet - Comprehensive metabolic panel  3. Parkinson's disease ([place]) Stable.Continue current medical treatment plan with Sinemet.  4. AKI (acute kidney injury) ([place]) Will check labs as below and f/u pending results. - CBC with Differential/Platelet - Comprehensive metabolic panel - POCT urinalysis dipstick  5. Urine findings abnormal UA positive.Continue to push fluids. Urine sent for culture. Will follow up pending C&S results. She is to call if symptoms do not improve or if they worsen.  - POCT urinalysis dipstick   No follow-ups on file.      Andres Shad, PA, have reviewed all documentation for this visit. The documentation on 10/19/19 for the exam, diagnosis, procedures, and orders are all accurate and complete.    Vernie Murders, [place] [phone] (phone) [phone] (fax)  [place]

## 2019-10-19 ENCOUNTER — Ambulatory Visit (INDEPENDENT_AMBULATORY_CARE_PROVIDER_SITE_OTHER): Payer: Medicare Other | Admitting: Family Medicine

## 2019-10-19 ENCOUNTER — Encounter: Payer: Self-pay | Admitting: Family Medicine

## 2019-10-19 ENCOUNTER — Other Ambulatory Visit: Payer: Self-pay

## 2019-10-19 VITALS — BP 139/77 | HR 71 | Temp 97.0°F | Resp 16 | Wt 107.6 lb

## 2019-10-19 DIAGNOSIS — N179 Acute kidney failure, unspecified: Secondary | ICD-10-CM

## 2019-10-19 DIAGNOSIS — Z4802 Encounter for removal of sutures: Secondary | ICD-10-CM | POA: Diagnosis not present

## 2019-10-19 DIAGNOSIS — G20 Parkinson's disease: Secondary | ICD-10-CM

## 2019-10-19 DIAGNOSIS — I10 Essential (primary) hypertension: Secondary | ICD-10-CM

## 2019-10-19 DIAGNOSIS — R8290 Unspecified abnormal findings in urine: Secondary | ICD-10-CM

## 2019-10-19 LAB — POCT URINALYSIS DIPSTICK
Bilirubin, UA: NEGATIVE
Glucose, UA: NEGATIVE
Ketones, UA: NEGATIVE
Nitrite, UA: NEGATIVE
Protein, UA: POSITIVE — AB
Spec Grav, UA: 1.02 (ref 1.010–1.025)
Urobilinogen, UA: 0.2 E.U./dL
pH, UA: 6 (ref 5.0–8.0)

## 2019-10-20 LAB — CBC WITH DIFFERENTIAL/PLATELET
Basophils Absolute: 0 10*3/uL (ref 0.0–0.2)
Basos: 0 %
EOS (ABSOLUTE): 0.1 10*3/uL (ref 0.0–0.4)
Eos: 1 %
Hematocrit: 37.6 % (ref 34.0–46.6)
Hemoglobin: 12.3 g/dL (ref 11.1–15.9)
Immature Grans (Abs): 0.2 10*3/uL — ABNORMAL HIGH (ref 0.0–0.1)
Immature Granulocytes: 2 %
Lymphocytes Absolute: 1.4 10*3/uL (ref 0.7–3.1)
Lymphs: 13 %
MCH: 29.1 pg (ref 26.6–33.0)
MCHC: 32.7 g/dL (ref 31.5–35.7)
MCV: 89 fL (ref 79–97)
Monocytes Absolute: 0.6 10*3/uL (ref 0.1–0.9)
Monocytes: 5 %
Neutrophils Absolute: 8.8 10*3/uL — ABNORMAL HIGH (ref 1.4–7.0)
Neutrophils: 79 %
Platelets: 437 10*3/uL (ref 150–450)
RBC: 4.23 x10E6/uL (ref 3.77–5.28)
RDW: 11.9 % (ref 11.7–15.4)
WBC: 11 10*3/uL — ABNORMAL HIGH (ref 3.4–10.8)

## 2019-10-20 LAB — COMPREHENSIVE METABOLIC PANEL
ALT: 6 IU/L (ref 0–32)
AST: 12 IU/L (ref 0–40)
Albumin/Globulin Ratio: 1.2 (ref 1.2–2.2)
Albumin: 3.7 g/dL (ref 3.7–4.7)
Alkaline Phosphatase: 120 IU/L (ref 48–121)
BUN/Creatinine Ratio: 18 (ref 12–28)
BUN: 22 mg/dL (ref 8–27)
Bilirubin Total: 0.4 mg/dL (ref 0.0–1.2)
CO2: 20 mmol/L (ref 20–29)
Calcium: 9.2 mg/dL (ref 8.7–10.3)
Chloride: 101 mmol/L (ref 96–106)
Creatinine, Ser: 1.25 mg/dL — ABNORMAL HIGH (ref 0.57–1.00)
GFR calc Af Amer: 49 mL/min/{1.73_m2} — ABNORMAL LOW (ref >59)
GFR calc non Af Amer: 42 mL/min/{1.73_m2} — ABNORMAL LOW (ref >59)
Globulin, Total: 3.1 g/dL (ref 1.5–4.5)
Glucose: 177 mg/dL — ABNORMAL HIGH (ref 65–99)
Potassium: 3.5 mmol/L (ref 3.5–5.2)
Sodium: 145 mmol/L — ABNORMAL HIGH (ref 134–144)
Total Protein: 6.8 g/dL (ref 6.0–8.5)

## 2019-10-21 LAB — URINE CULTURE

## 2019-11-14 ENCOUNTER — Other Ambulatory Visit: Payer: Self-pay | Admitting: Family Medicine

## 2019-11-14 DIAGNOSIS — I10 Essential (primary) hypertension: Secondary | ICD-10-CM

## 2019-12-28 ENCOUNTER — Telehealth: Payer: Self-pay | Admitting: Family Medicine

## 2019-12-28 NOTE — Telephone Encounter (Signed)
Copied from CRM [id number]. Topic: Medicare AWV >> Dec 28, 2019  4:27 PM Claudette Laws R wrote: Reason for CRM:  Left message for patient to call back and schedule Medicare Annual Wellness Visit (AWV) either virtually or in office.  Last AWV : 09/08/2017    Please schedule at anytime with [facility] Health Advisor.

## 2020-02-01 ENCOUNTER — Telehealth: Payer: Self-pay | Admitting: Family Medicine

## 2020-02-01 NOTE — Telephone Encounter (Signed)
Copied from CRM [id number]. Topic: Medicare AWV >> Feb 01, 2020  2:17 PM Claudette Laws R wrote: Reason for CRM:  Left message for patient to call back and schedule Medicare Annual Wellness Visit (AWV) either virtually or in office.  Last AWV 09/08/2017  Please schedule at anytime with [facility] Health Advisor.  If any questions, please contact me at [phone]

## 2020-02-19 DIAGNOSIS — R7989 Other specified abnormal findings of blood chemistry: Secondary | ICD-10-CM | POA: Diagnosis not present

## 2020-02-19 DIAGNOSIS — K922 Gastrointestinal hemorrhage, unspecified: Secondary | ICD-10-CM | POA: Diagnosis not present

## 2020-02-19 DIAGNOSIS — S299XXA Unspecified injury of thorax, initial encounter: Secondary | ICD-10-CM | POA: Diagnosis not present

## 2020-02-19 DIAGNOSIS — Z4682 Encounter for fitting and adjustment of non-vascular catheter: Secondary | ICD-10-CM | POA: Diagnosis not present

## 2020-02-19 DIAGNOSIS — K449 Diaphragmatic hernia without obstruction or gangrene: Secondary | ICD-10-CM | POA: Diagnosis not present

## 2020-02-19 DIAGNOSIS — Z9911 Dependence on respirator [ventilator] status: Secondary | ICD-10-CM | POA: Diagnosis not present

## 2020-02-19 DIAGNOSIS — Z86711 Personal history of pulmonary embolism: Secondary | ICD-10-CM | POA: Diagnosis not present

## 2020-02-19 DIAGNOSIS — Z8669 Personal history of other diseases of the nervous system and sense organs: Secondary | ICD-10-CM | POA: Diagnosis not present

## 2020-02-19 DIAGNOSIS — Z66 Do not resuscitate: Secondary | ICD-10-CM | POA: Diagnosis not present

## 2020-02-19 DIAGNOSIS — D62 Acute posthemorrhagic anemia: Secondary | ICD-10-CM | POA: Diagnosis not present

## 2020-02-19 DIAGNOSIS — R918 Other nonspecific abnormal finding of lung field: Secondary | ICD-10-CM | POA: Diagnosis not present

## 2020-02-19 DIAGNOSIS — K5730 Diverticulosis of large intestine without perforation or abscess without bleeding: Secondary | ICD-10-CM | POA: Diagnosis not present

## 2020-02-19 DIAGNOSIS — N261 Atrophy of kidney (terminal): Secondary | ICD-10-CM | POA: Diagnosis not present

## 2020-02-19 DIAGNOSIS — M47812 Spondylosis without myelopathy or radiculopathy, cervical region: Secondary | ICD-10-CM | POA: Diagnosis not present

## 2020-02-19 DIAGNOSIS — J690 Pneumonitis due to inhalation of food and vomit: Secondary | ICD-10-CM | POA: Diagnosis not present

## 2020-02-19 DIAGNOSIS — S02609A Fracture of mandible, unspecified, initial encounter for closed fracture: Secondary | ICD-10-CM | POA: Diagnosis not present

## 2020-02-19 DIAGNOSIS — G92 Toxic encephalopathy: Secondary | ICD-10-CM | POA: Diagnosis not present

## 2020-02-19 DIAGNOSIS — S3992XA Unspecified injury of lower back, initial encounter: Secondary | ICD-10-CM | POA: Diagnosis not present

## 2020-02-19 DIAGNOSIS — S80212A Abrasion, left knee, initial encounter: Secondary | ICD-10-CM | POA: Diagnosis not present

## 2020-02-19 DIAGNOSIS — G20 Parkinson's disease: Secondary | ICD-10-CM | POA: Diagnosis not present

## 2020-02-19 DIAGNOSIS — S51801A Unspecified open wound of right forearm, initial encounter: Secondary | ICD-10-CM | POA: Diagnosis not present

## 2020-02-19 DIAGNOSIS — J90 Pleural effusion, not elsewhere classified: Secondary | ICD-10-CM | POA: Diagnosis not present

## 2020-02-19 DIAGNOSIS — D689 Coagulation defect, unspecified: Secondary | ICD-10-CM | POA: Diagnosis not present

## 2020-02-19 DIAGNOSIS — I2699 Other pulmonary embolism without acute cor pulmonale: Secondary | ICD-10-CM | POA: Diagnosis not present

## 2020-02-19 DIAGNOSIS — D649 Anemia, unspecified: Secondary | ICD-10-CM | POA: Diagnosis not present

## 2020-02-19 DIAGNOSIS — Z515 Encounter for palliative care: Secondary | ICD-10-CM | POA: Diagnosis not present

## 2020-02-19 DIAGNOSIS — S51802A Unspecified open wound of left forearm, initial encounter: Secondary | ICD-10-CM | POA: Diagnosis not present

## 2020-02-19 DIAGNOSIS — M419 Scoliosis, unspecified: Secondary | ICD-10-CM | POA: Diagnosis not present

## 2020-02-19 DIAGNOSIS — E872 Acidosis: Secondary | ICD-10-CM | POA: Diagnosis not present

## 2020-02-19 DIAGNOSIS — S3993XA Unspecified injury of pelvis, initial encounter: Secondary | ICD-10-CM | POA: Diagnosis not present

## 2020-02-19 DIAGNOSIS — S199XXA Unspecified injury of neck, initial encounter: Secondary | ICD-10-CM | POA: Diagnosis not present

## 2020-02-19 DIAGNOSIS — J9600 Acute respiratory failure, unspecified whether with hypoxia or hypercapnia: Secondary | ICD-10-CM | POA: Diagnosis not present

## 2020-02-19 DIAGNOSIS — S80211A Abrasion, right knee, initial encounter: Secondary | ICD-10-CM | POA: Diagnosis not present

## 2020-02-19 DIAGNOSIS — K8020 Calculus of gallbladder without cholecystitis without obstruction: Secondary | ICD-10-CM | POA: Diagnosis not present

## 2020-02-19 DIAGNOSIS — S0990XA Unspecified injury of head, initial encounter: Secondary | ICD-10-CM | POA: Diagnosis not present

## 2020-02-19 DIAGNOSIS — N179 Acute kidney failure, unspecified: Secondary | ICD-10-CM | POA: Diagnosis not present

## 2020-02-19 DIAGNOSIS — M5030 Other cervical disc degeneration, unspecified cervical region: Secondary | ICD-10-CM | POA: Diagnosis not present

## 2020-02-20 DIAGNOSIS — J90 Pleural effusion, not elsewhere classified: Secondary | ICD-10-CM | POA: Diagnosis not present

## 2020-02-20 DIAGNOSIS — G92 Toxic encephalopathy: Secondary | ICD-10-CM | POA: Diagnosis not present

## 2020-02-20 DIAGNOSIS — E872 Acidosis: Secondary | ICD-10-CM | POA: Diagnosis not present

## 2020-02-20 DIAGNOSIS — Z515 Encounter for palliative care: Secondary | ICD-10-CM | POA: Diagnosis not present

## 2020-02-20 DIAGNOSIS — R7989 Other specified abnormal findings of blood chemistry: Secondary | ICD-10-CM | POA: Diagnosis not present

## 2020-02-20 DIAGNOSIS — N179 Acute kidney failure, unspecified: Secondary | ICD-10-CM | POA: Diagnosis not present

## 2020-02-20 DIAGNOSIS — J9600 Acute respiratory failure, unspecified whether with hypoxia or hypercapnia: Secondary | ICD-10-CM | POA: Diagnosis not present

## 2020-02-20 DIAGNOSIS — I2699 Other pulmonary embolism without acute cor pulmonale: Secondary | ICD-10-CM | POA: Diagnosis not present

## 2020-02-20 DIAGNOSIS — G20 Parkinson's disease: Secondary | ICD-10-CM | POA: Diagnosis not present

## 2020-02-21 DIAGNOSIS — G92 Toxic encephalopathy: Secondary | ICD-10-CM | POA: Diagnosis not present

## 2020-02-21 DIAGNOSIS — J90 Pleural effusion, not elsewhere classified: Secondary | ICD-10-CM | POA: Diagnosis not present

## 2020-02-21 DIAGNOSIS — G20 Parkinson's disease: Secondary | ICD-10-CM | POA: Diagnosis not present

## 2020-03-03 DEATH — deceased

## 2020-06-20 ENCOUNTER — Telehealth: Payer: Self-pay | Admitting: Family Medicine

## 2020-06-20 NOTE — Telephone Encounter (Signed)
Copied from CRM [id number]. Topic: Medicare AWV >> Jun 20, 2020 10:06 AM Claudette Laws R wrote: Reason for CRM:   No answer unable to leave a message for patient to call back and schedule Medicare Annual Wellness Visit (AWV) in office.   If not able to come in office, please offer to do virtually.   Last AWV 09/08/2017  Please schedule at anytime with [facility] Health Advisor.  If any questions, please contact me at [phone]

## 2020-09-10 IMAGING — CT CT CERVICAL SPINE W/O CM
4 of 8 series · 11 of 33 positions shown, 12 images · non-contrast
Comparison: Head CT 03/17/2014

CLINICAL DATA: Syncopal episode prior to arrival hitting back of
head on the top. Head and neck pain.

EXAM:
CT HEAD WITHOUT CONTRAST
CT CERVICAL SPINE WITHOUT CONTRAST
TECHNIQUE: Multidetector CT imaging of the head and cervical spine was
performed following the standard protocol without intravenous
contrast. Multiplanar CT image reconstructions of the cervical spine
were also generated.

[Series 8: c spine soft · axial · 0.35mm/px · z∈[+229,+309]mm · 3 of 80 slices shown]
[im 20/80  soft-tissue]
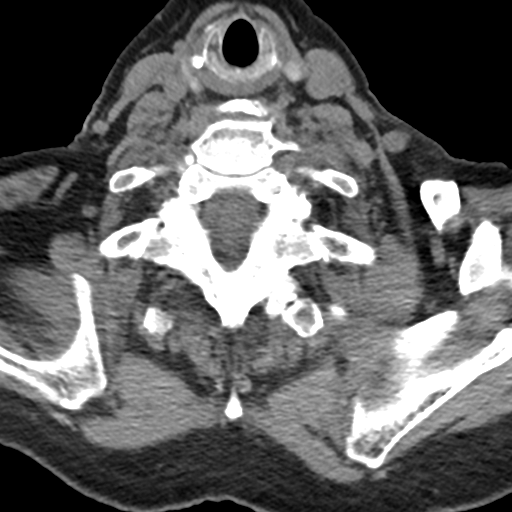
[im 40/80  soft-tissue]
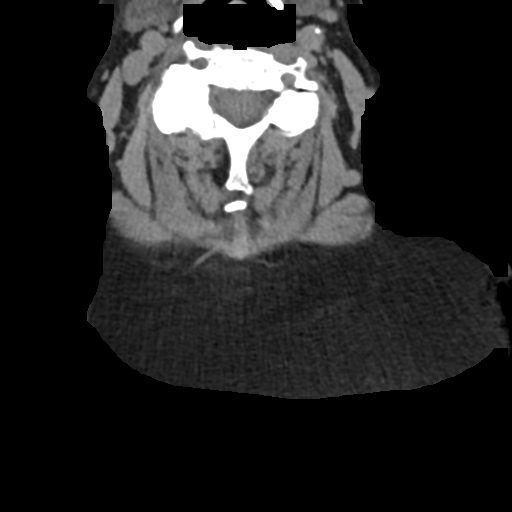
[im 60/80  soft-tissue]
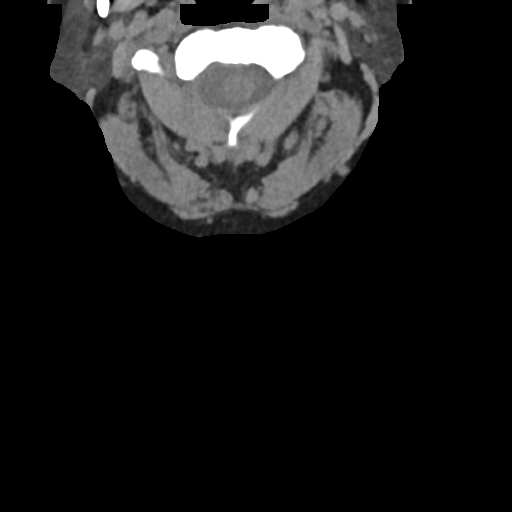

[Series 9: sagittal bone · sagittal · 0.30mm/px · 4 of 41 slices shown]
[im 9/41  bone]
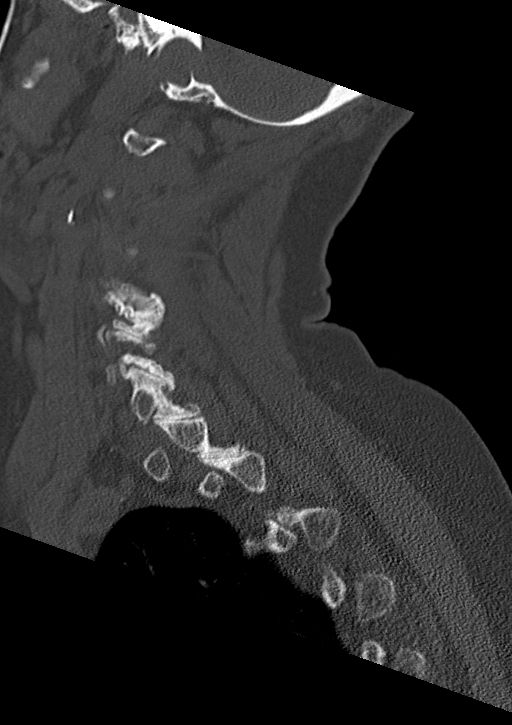
[im 17/41  bone]
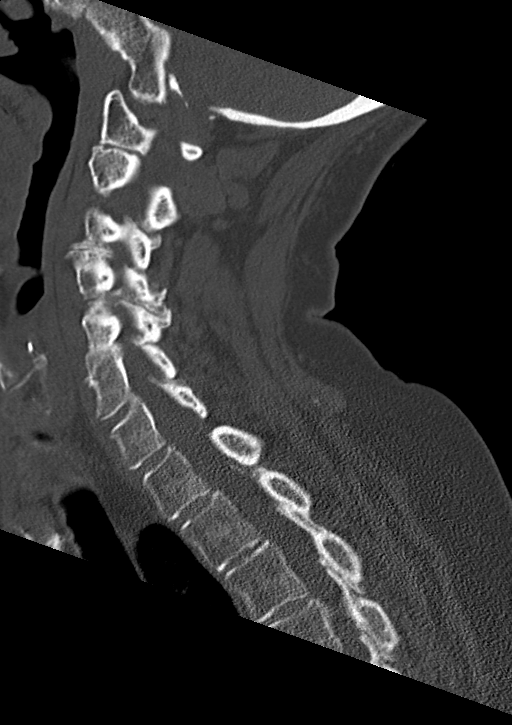
[im 25/41  bone]
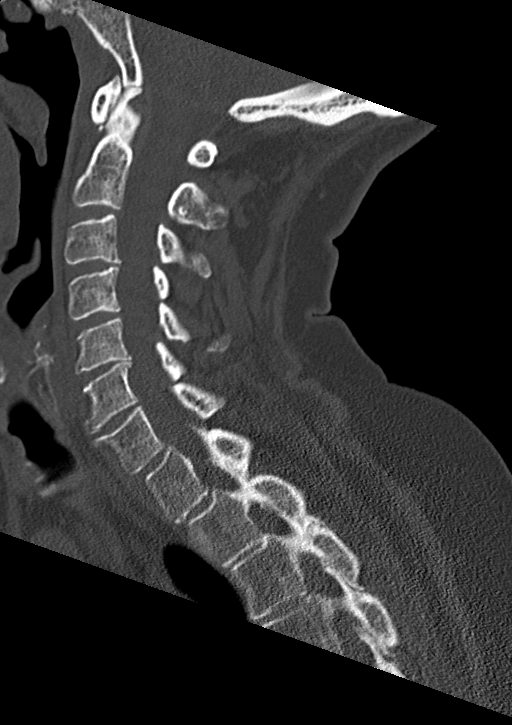
[im 33/41  bone]
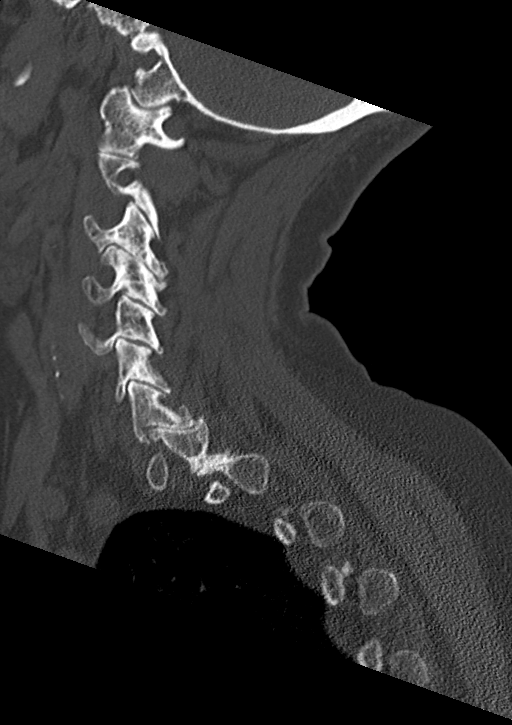

[Series 10: coronal bone · coronal · 0.23mm/px · 1 of 48 slices shown]
[im 24/48  bone]
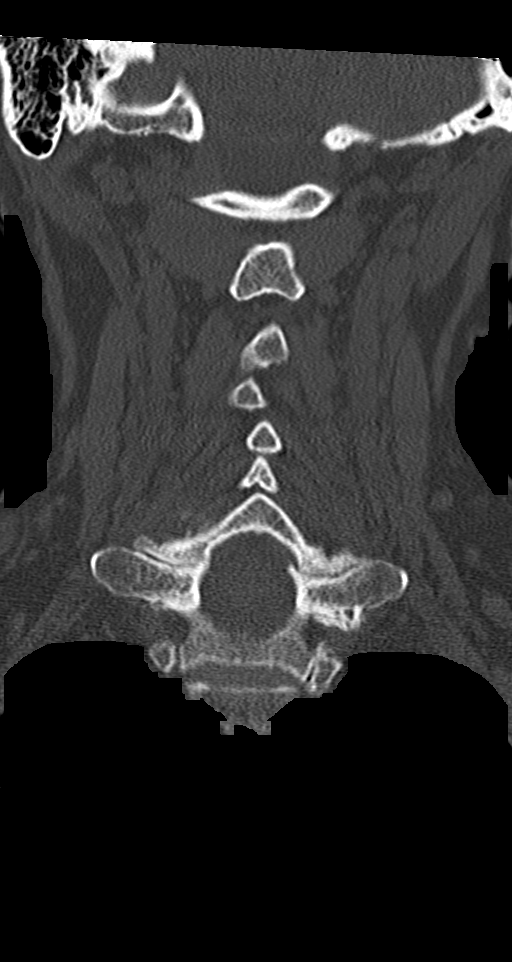

[Series 11: orthogonal bone · axial · 0.20mm/px · z∈[+199,+281]mm · 3 of 90 slices shown, 4 images]
[im 23/90  soft-tissue]
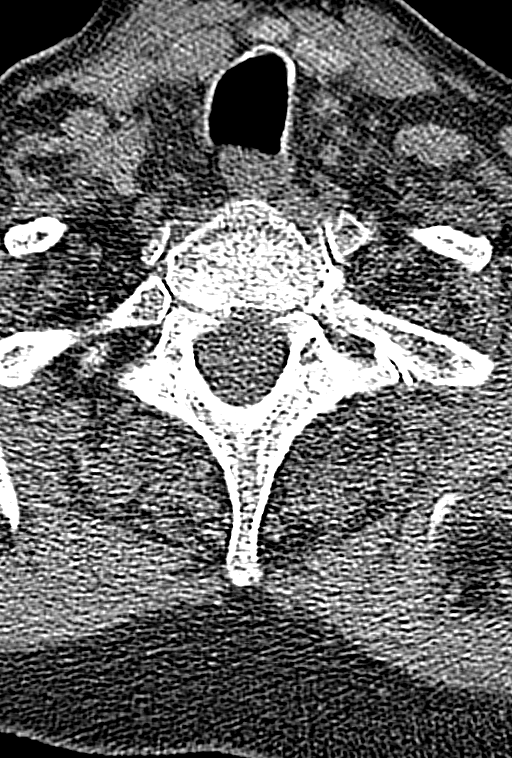
[im 23/90  bone]
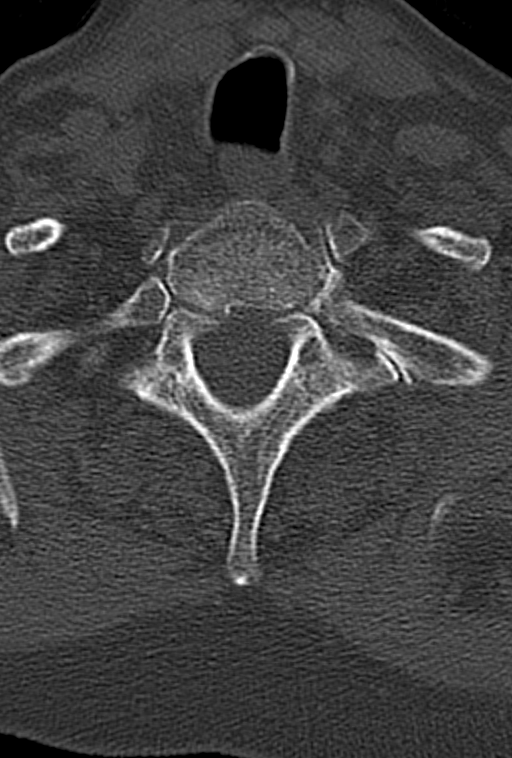
[im 45/90  bone]
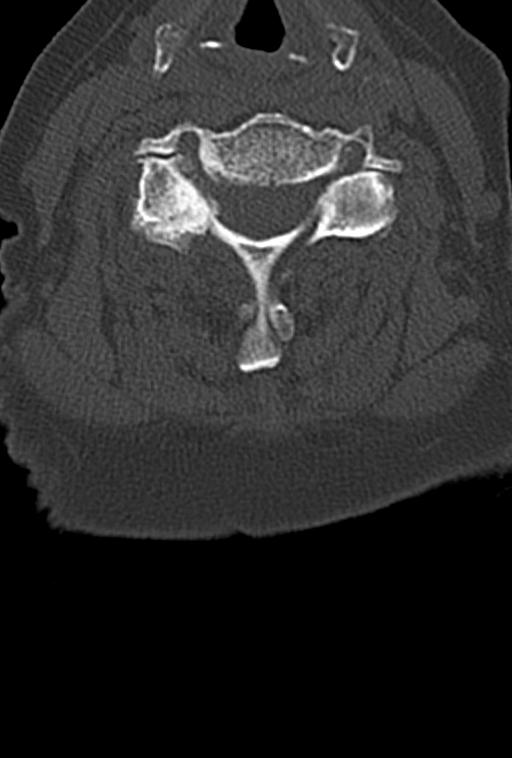
[im 67/90  bone]
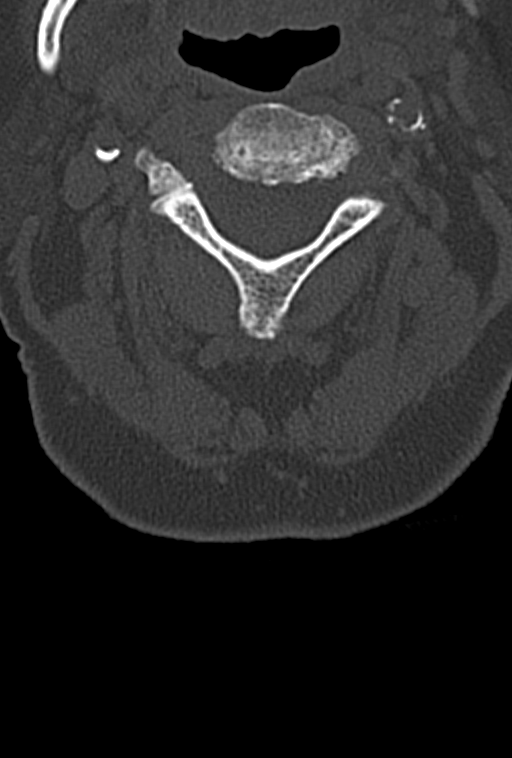

[11 of 33 positions shown; findings below may reference images not displayed]

FINDINGS: CT HEAD FINDINGS

Brain: Age related mild involutional changes of the brain with
chronic minimal small vessel ischemic disease. No large vascular
territory infarct, hemorrhage, midline shift or edema. No
intra-axial mass nor extra-axial fluid collections.

Vascular: Atherosclerosis of the carotid siphons. No hyperdense
vessel sign.

Skull: No acute skull fracture. No suspicious osseous lesions.

Sinuses/Orbits: Intact orbits and globes. Clear paranasal sinuses
and mastoids.

Other: Mild soft tissue swelling of the left occipital scalp.

CT CERVICAL SPINE FINDINGS

Alignment: Slight grade 1 retrolisthesis of C5 on C6 likely on basis
of degenerative disc and facet arthropathy. Maintained cervical
lordosis.

Skull base and vertebrae: Intact skull base and vertebral bodies. No
suspicious osseous lesions fracture.

Soft tissues and spinal canal: No prevertebral fluid or swelling. No
visible canal hematoma.

Disc levels: Mild disc flattening C2 through C7. Uncinate spurring
on the left at C2-3, bilaterally at C3-4, minimally on the right at
C4-5, bilaterally left greater than right at C5-6 and C6-7. This in
conjunction with facet arthrosis contribute to variable degrees of
foraminal encroachment, greatest on right at C4-5. Multilevel
degenerative facet sclerosis, cystic change and hypertrophy of the
cervical facets. No jumped or perched appearing facet is noted.

Upper chest: Negative.

Other: Extracranial carotid arteriosclerosis. Multinodular goiter
bilateral hypodense nodules noted.
IMPRESSION: 1. Mild soft tissue swelling of the left occipital scalp without
underlying skull fracture.
2. No acute intracranial abnormality.
3. Cervical spondylosis without acute posttraumatic cervical spine
fracture.
4. Multinodular goiter.
